# Patient Record
Sex: Male | Born: 1970 | Race: White | Hispanic: No | Marital: Married
Health system: Southern US, Community
[De-identification: ages and names within clinical notes are randomized; demographics above are authoritative.]

## PROBLEM LIST (undated history)

## (undated) DIAGNOSIS — I2510 Atherosclerotic heart disease of native coronary artery without angina pectoris: Secondary | ICD-10-CM

## (undated) DIAGNOSIS — D860 Sarcoidosis of lung: Secondary | ICD-10-CM

## (undated) DIAGNOSIS — I214 Non-ST elevation (NSTEMI) myocardial infarction: Secondary | ICD-10-CM

## (undated) DIAGNOSIS — I10 Essential (primary) hypertension: Secondary | ICD-10-CM

## (undated) DIAGNOSIS — M109 Gout, unspecified: Secondary | ICD-10-CM

## (undated) DIAGNOSIS — E669 Obesity, unspecified: Secondary | ICD-10-CM

## (undated) HISTORY — PX: INGUINAL HERNIA REPAIR: SHX194

---

## 1998-09-28 ENCOUNTER — Ambulatory Visit (HOSPITAL_COMMUNITY): Admission: RE | Admit: 1998-09-28 | Discharge: 1998-09-28 | Payer: Self-pay | Admitting: Internal Medicine

## 1998-09-28 ENCOUNTER — Encounter: Payer: Self-pay | Admitting: Internal Medicine

## 1998-11-02 ENCOUNTER — Ambulatory Visit: Admission: RE | Admit: 1998-11-02 | Discharge: 1998-11-02 | Payer: Self-pay | Admitting: Internal Medicine

## 1999-04-27 ENCOUNTER — Inpatient Hospital Stay (HOSPITAL_COMMUNITY): Admission: EM | Admit: 1999-04-27 | Discharge: 1999-05-02 | Payer: Self-pay | Admitting: Emergency Medicine

## 1999-04-28 ENCOUNTER — Encounter: Payer: Self-pay | Admitting: Critical Care Medicine

## 2003-04-23 ENCOUNTER — Ambulatory Visit (HOSPITAL_COMMUNITY): Admission: RE | Admit: 2003-04-23 | Discharge: 2003-04-23 | Payer: Self-pay | Admitting: Urology

## 2003-04-23 ENCOUNTER — Encounter (INDEPENDENT_AMBULATORY_CARE_PROVIDER_SITE_OTHER): Payer: Self-pay | Admitting: Specialist

## 2010-01-26 ENCOUNTER — Encounter (INDEPENDENT_AMBULATORY_CARE_PROVIDER_SITE_OTHER): Payer: Self-pay | Admitting: Cardiology

## 2010-01-26 ENCOUNTER — Observation Stay (HOSPITAL_COMMUNITY): Admission: EM | Admit: 2010-01-26 | Discharge: 2010-01-27 | Payer: Self-pay | Admitting: Emergency Medicine

## 2010-10-22 LAB — DIFFERENTIAL
Basophils Absolute: 0 K/uL (ref 0.0–0.1)
Basophils Relative: 0 % (ref 0–1)
Eosinophils Absolute: 0 K/uL (ref 0.0–0.7)
Eosinophils Relative: 0 % (ref 0–5)
Lymphocytes Relative: 13 % (ref 12–46)
Lymphs Abs: 1.3 K/uL (ref 0.7–4.0)
Monocytes Absolute: 0.9 K/uL (ref 0.1–1.0)
Monocytes Relative: 9 % (ref 3–12)
Neutro Abs: 7.8 K/uL — ABNORMAL HIGH (ref 1.7–7.7)
Neutrophils Relative %: 78 % — ABNORMAL HIGH (ref 43–77)

## 2010-10-22 LAB — RAPID URINE DRUG SCREEN, HOSP PERFORMED
Amphetamines: NOT DETECTED
Benzodiazepines: NOT DETECTED
Cocaine: NOT DETECTED
Opiates: NOT DETECTED
Tetrahydrocannabinol: NOT DETECTED

## 2010-10-22 LAB — COMPREHENSIVE METABOLIC PANEL
ALT: 31 U/L (ref 0–53)
Albumin: 3.9 g/dL (ref 3.5–5.2)
CO2: 19 mEq/L (ref 19–32)
Calcium: 9.2 mg/dL (ref 8.4–10.5)
Creatinine, Ser: 1.2 mg/dL (ref 0.4–1.5)
Glucose, Bld: 124 mg/dL — ABNORMAL HIGH (ref 70–99)
Potassium: 3.3 mEq/L — ABNORMAL LOW (ref 3.5–5.1)
Sodium: 139 mEq/L (ref 135–145)
Total Bilirubin: 0.6 mg/dL (ref 0.3–1.2)
Total Protein: 7.4 g/dL (ref 6.0–8.3)

## 2010-10-22 LAB — BASIC METABOLIC PANEL
Chloride: 106 mEq/L (ref 96–112)
Creatinine, Ser: 1.2 mg/dL (ref 0.4–1.5)
GFR calc Af Amer: >60 mL/min (ref >60)
Glucose, Bld: 95 mg/dL (ref 70–99)

## 2010-10-22 LAB — CBC
HCT: 47.3 % (ref 39.0–52.0)
Hemoglobin: 16.5 g/dL (ref 13.0–17.0)
MCH: 30.7 pg (ref 26.0–34.0)
MCHC: 34.8 g/dL (ref 30.0–36.0)
MCV: 88.1 fL (ref 78.0–100.0)
Platelets: 200 K/uL (ref 150–400)
RBC: 5.36 MIL/uL (ref 4.22–5.81)
RDW: 12.5 % (ref 11.5–15.5)
WBC: 10.1 K/uL (ref 4.0–10.5)

## 2010-10-22 LAB — CARDIAC PANEL(CRET KIN+CKTOT+MB+TROPI)
CK, MB: 1.6 ng/mL (ref 0.3–4.0)
CK, MB: 1.8 ng/mL (ref 0.3–4.0)
Relative Index: 1.5 (ref 0.0–2.5)
Relative Index: 1.6 (ref 0.0–2.5)
Total CK: 108 U/L (ref 7–232)
Total CK: 114 U/L (ref 7–232)

## 2010-10-22 LAB — LIPID PANEL
HDL: 35 mg/dL — ABNORMAL LOW
Total CHOL/HDL Ratio: 5.2 ratio
Triglycerides: 64 mg/dL
VLDL: 13 mg/dL (ref 0–40)

## 2010-10-22 LAB — URINALYSIS, ROUTINE W REFLEX MICROSCOPIC
Glucose, UA: NEGATIVE mg/dL
Hgb urine dipstick: NEGATIVE
Ketones, ur: NEGATIVE mg/dL
Nitrite: NEGATIVE
Protein, ur: NEGATIVE mg/dL
Specific Gravity, Urine: 1.031 — ABNORMAL HIGH (ref 1.005–1.030)
Urobilinogen, UA: 0.2 mg/dL (ref 0.0–1.0)

## 2010-10-22 LAB — POCT I-STAT, CHEM 8
BUN: 12 mg/dL (ref 6–23)
Calcium, Ion: 1.03 mmol/L — ABNORMAL LOW (ref 1.12–1.32)
Chloride: 108 meq/L (ref 96–112)
Creatinine, Ser: 1.1 mg/dL (ref 0.4–1.5)
Glucose, Bld: 125 mg/dL — ABNORMAL HIGH (ref 70–99)
HCT: 49 % (ref 39.0–52.0)
Hemoglobin: 16.7 g/dL (ref 13.0–17.0)
Potassium: 3.3 meq/L — ABNORMAL LOW (ref 3.5–5.1)
Sodium: 140 meq/L (ref 135–145)
TCO2: 19 mmol/L (ref 0–100)

## 2010-10-22 LAB — TROPONIN I

## 2010-10-22 LAB — CK TOTAL AND CKMB (NOT AT ARMC)
CK, MB: 1.7 ng/mL (ref 0.3–4.0)
Relative Index: 1.5 (ref 0.0–2.5)
Total CK: 114 U/L (ref 7–232)

## 2010-10-22 LAB — TSH: TSH: 5.232 u[IU]/mL — ABNORMAL HIGH (ref 0.350–4.500)

## 2010-10-22 LAB — POCT CARDIAC MARKERS: Myoglobin, poc: 108 ng/mL (ref 12–200)

## 2010-12-22 NOTE — Op Note (Signed)
NAME:  Brett Harper, Brett Harper NO.:  [id number]   MEDICAL RECORD NO.:  [id number]                   PATIENT TYPE:  AMB   LOCATION:  [facility]                                 FACILITY:  [facility]   PHYSICIAN:  Ronald L. Ovidio Hanger, M.D.           DATE OF BIRTH:  10-Jun-1971   DATE OF PROCEDURE:  05/09/2003  DATE OF DISCHARGE:  04/23/2003                                 OPERATIVE REPORT   PREOPERATIVE DIAGNOSIS:  Multiple left testicular masses.   OPERATION/PROCEDURE:  Left inguinal exploration of testicle with excisional  biopsy of testicular masses and frozen section and flexible  cystourethroscopy.   SURGEON:  Lucrezia Starch. Earlene Plater, M.D.   ANESTHESIA:  LMA as well as 15 ml.   TUBES:  None.   COMPLICATIONS:  None   INDICATIONS:  Brett Harper is a very nice 40 year old white male who presented  with multiple left testicular masses.  He had noticed he had one and it  really was not tender but multiple have appeared and on scrotal ultrasound  in the office on the left side, he was noted to have multiple solid renal  masses that appeared to be approximately the same size.  CT scan of the  chest, abdomen and pelvis were essentially negative and beta hCG, alpha-  fetoprotein and LDH were negative.  After understanding risks, benefits and  alternatives, he elected to proceed with the above procedure.   DESCRIPTION OF PROCEDURE:  The patient was placed in the supine position.  Under proper LMA anesthesia he was prepped and draped in the Betadine  sterile fashion and left inguinal incision was made.  Sharp dissection  was  carried down to the inguinal region.  The shelving edge of Poupart's fascia  was incised to unroof the inguinal canal and the cord was delivered.  The  ilioinguinal nerve was identified and protected.  Testicle and adnexa were  delivered.  Gubernacular fibers were dissected.  The testicle was then  isolated.  The cord was wrapped with a quarter-inch Penrose  drain during  retrograde flow of potential tumor cells.  And the tunica vaginalis was  opened.  Multiple white nodular areas were noted to be present throughout  the testicle and the previously defined ultrasound locations.  A wedge  biopsy was obtained and one was sent for frozen section.  It was found to  have essentially granulomas which were noncaseating.  Two others were wedged  out for both permanent and frozen section and again frozen section revealed  granulomas only.  No neoplasia.  Tissue specimen was then submitted for  special stains, tissue cultures, TB, fungi, aerobic, anaerobic, etc., and  permanent section.  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  Irrigation  was performed with good hemostasis noted to be present.  The fascia was closed with running 3-0 Vicryl suture.  Scarpa's fascia was  closed with interrupted 3-0 chromic catgut. Skin was closed with skin  stables.  Testicle was well into the scrotal sac.  It might be noted a  quarter-inch Penrose drain was placed through a separate stab incision and  tunneled through the canal in the scrotal area and the patient was then  sutured in placed in place with chromic catgut.  The patient was taken to  the recovery room stable with no complications.                                               Ronald L. Ovidio Hanger, M.D.    RLD/MEDQ  D:  05/09/2003  T:  05/09/2003  Job:  [id number]

## 2011-08-14 ENCOUNTER — Other Ambulatory Visit (HOSPITAL_COMMUNITY): Payer: Self-pay | Admitting: Neurosurgery

## 2011-08-14 ENCOUNTER — Other Ambulatory Visit: Payer: Self-pay | Admitting: Neurosurgery

## 2011-08-14 DIAGNOSIS — M5416 Radiculopathy, lumbar region: Secondary | ICD-10-CM

## 2011-08-22 ENCOUNTER — Ambulatory Visit (HOSPITAL_COMMUNITY)
Admission: RE | Admit: 2011-08-22 | Discharge: 2011-08-22 | Disposition: A | Discharge status: Home / Self Care | Payer: BC Managed Care – PPO | Source: Ambulatory Visit | Attending: Neurosurgery | Admitting: Neurosurgery

## 2011-08-22 ENCOUNTER — Other Ambulatory Visit (HOSPITAL_COMMUNITY): Payer: Self-pay

## 2011-08-22 DIAGNOSIS — M5416 Radiculopathy, lumbar region: Secondary | ICD-10-CM

## 2011-08-22 DIAGNOSIS — M545 Low back pain, unspecified: Secondary | ICD-10-CM | POA: Insufficient documentation

## 2011-08-22 DIAGNOSIS — M5126 Other intervertebral disc displacement, lumbar region: Secondary | ICD-10-CM | POA: Insufficient documentation

## 2011-08-22 DIAGNOSIS — M25559 Pain in unspecified hip: Secondary | ICD-10-CM | POA: Insufficient documentation

## 2011-08-22 MED ORDER — DIAZEPAM 5 MG PO TABS
ORAL_TABLET | ORAL | Status: AC
  Filled 2011-08-22: qty 2

## 2011-08-22 MED ORDER — ONDANSETRON HCL 4 MG/2ML IJ SOLN: 4.0000 mg | Freq: Four times a day (QID) | INTRAMUSCULAR | Status: DC | PRN

## 2011-08-22 MED ORDER — OXYCODONE-ACETAMINOPHEN 5-325 MG PO TABS
1.0000 | ORAL_TABLET | ORAL | Status: DC | PRN
  Administered 2011-08-22: 2 via ORAL

## 2011-08-22 MED ORDER — DIAZEPAM 5 MG PO TABS
10.0000 mg | ORAL_TABLET | Freq: Once | ORAL | Status: AC
  Administered 2011-08-22: 10 mg via ORAL

## 2011-08-22 MED ORDER — OXYCODONE-ACETAMINOPHEN 5-325 MG PO TABS
ORAL_TABLET | ORAL | Status: AC
  Administered 2011-08-22: 2 via ORAL
  Filled 2011-08-22: qty 2

## 2011-08-22 MED ORDER — IOHEXOL 180 MG/ML  SOLN
20.0000 mL | Freq: Once | INTRAMUSCULAR | Status: AC | PRN
  Administered 2011-08-22: 20 mL via INTRATHECAL

## 2011-08-22 NOTE — Discharge Instructions (Signed)
Myelography Myelography is an X-ray test that uses a dye to look at your spine or neck. This test is usually done to look for:  Back pain.   Neck pain.   Arm or leg weakness or numbness.  BEFORE THE PROCEDURE  Take your medicine as told by your doctor.   Eat food and drink fluids as told by your doctor.   Do not drive. Have someone else drive you to the test and drive you home.   Your doctor will talk to you about risks of the myelography and answer questions you may have.  PROCEDURE  You will be awake during the procedure.   You may be asked to lie on your stomach during the procedure.   Your neck or back will be cleaned.   A numbing medicine will be injected into that area.   A small needle is inserted through the skin that was numbed. A dye will be put into the needle.   The needle will be taken out.   X-ray pictures will be taken of your neck or back.   You may also have more procedures to get different views of your neck or back.  AFTER THE PROCEDURE  You will rest in a recovery room until you are stable and doing well.   You will lie down with your head resting on 1 pillow.   You may be given something to eat or drink.   Someone will need to drive you home.  Finding out the results of your test Ask when your test results will be ready. Make sure you get your test results. Document Released: 05/01/2008 Document Revised: 03/21/2011 Document Reviewed: 05/01/2008 [employer] Patient Information 2012 [place], [place].

## 2011-08-22 NOTE — Progress Notes (Signed)
Discharge instructions reviewed with patient and wife. Verbally indicated understanding. Copy of discharge instructions given. Pain easing and around 4/10 now right hip and leg. Puncture site to back clean and dry. Tolerated food and fluids well.

## 2011-08-22 NOTE — Progress Notes (Signed)
Voided in [facility]. Discharged in w/c with RN to be driven home by wife. Aware he should lie down for the ride.

## 2011-08-22 NOTE — Progress Notes (Signed)
To nurses station post myelogram. Pain is easing now 4-5/10, post Percocet. Puncture site to back clean and dry, no bandaid. Given crackers and coke.

## 2011-08-22 NOTE — Progress Notes (Signed)
Dr. Mikal Plane paged re need for pain meds, called back and stated he would put in the orders.

## 2011-08-22 NOTE — H&P (Signed)
BP 137/84  Pulse 84  Temp(Src) 98.4 F (36.9 C) (Oral)  Resp 18  Ht 5\' 11"  (1.803 m)  Wt 142.883 kg (315 lb)  BMI 43.93 kg/m2  SpO2 90% HISTORY:     Brett Harper is a 41 year old gentleman who presents today for evaluation of severe pain which he has in his right hip which extends to the right knee.  The pain never goes below the knee.  The pain is constant whether he is standing, lying or sitting.  It is worse whenever he has to bear weight.  He walks with a decided limp and has done so since October 12th.  He has had no bowel or bladder dysfunction.  He was on steroids for approximately a month.  He has absolutely no discomfort on his left side.  He describes pain, numbness and burning in the right thigh.  He said this started right after he received treatment for a kidney stone and he was actually in the hospital when the pain started.  He has had absolutely no trauma.  He says he is still trying to get to work and he is not trying to take any time off.  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  He has never had pain like this before.  He feels weakness in his right leg, numbness in the right leg.  He does report fatigue also and difficulty with sleeping and insomnia.    PAST MEDICAL HISTORY:  Significant for hypertension and sarcoid.    FAMILY HISTORY:    Mother 4 in very good health, has undergone a hip and knee replacement.  Father is deceased.  Father had both cerebral vascular accident and myocardial infarction.    PAST SURGICAL HISTORY:  He has had a kidney stone removed.  He has undergone a herniorrhaphy and testicular surgery.    DRUG ALLERGIES:   HE IS UNABLE TO TAKE NAPROSYN AS IT CAUSES HIM TO BLEED.  HE DID NOT COMMENT ANYMORE ABOUT BLEED.    SOCIAL HISTORY:    He does not smoke.  He does drink alcohol.  He does not use illicit drugs.  He states that is 5', 11" and 320 lbs.  Pulse on examination is 63 and respiratory rate was 16.    REVIEW OF  SYSTEMS:   Positive for hypertension, leg pain with walking, kidney stones, leg weakness, back pain, leg pain.  He denies constitutional, eye, ear, nose, throat, mouth, respiratory, gastrointestinal, skin, neurological, psychiatric, endocrine, hematologic and allergic problems.    MEDICATIONS:    Currently he is taking Lisinopril and Vicodin.  He is taking 8 Vicodin tablets a day for his pain.     EXAMINATION:    On examination he is alert, oriented x 4 and answering all questions appropriately.  Pain with passive flexion, abduction and internal and external rotation of the right hip.  Negative straight leg raising.  Intact proprioception.  Intact light touch, though it is different in his right thigh compared to the left.  He has normal muscle tone, bulk and coordination.  Markedly antalgic gait favoring the right lower extremity.  He hops almost when he is walking.  2+ reflexes which are very brisk and energetic at both knees and both ankles.  Normal reflexes 2+ in the upper extremities biceps, triceps and brachioradialis.  Normal muscle tone, bulk and coordination in the upper extremities.  Pupils are equal, round and reactive to light.  Full extraocular movements.  Symmetric facies.  Symmetric facial movements.  Hearing intact to voice bilaterally.    DIAGNOSTIC STUDIES:   MRI is reviewed.  It shows what is a tiny disc at L5-S1, if I can even call it that, but there is no overt compression of the S1 root or of the L5 root.  The foramen is widely patent and I just do not see what the radiologist talks about when he says that the central disc impacts upon the right S1 root.  But, he has nothing at L1-2, L2-3 or L3-4.  He has some degeneration present at the disc spaces, but no neural compression.    I read a report of the November 11th CT scan of the abdomen when they were evaluating him for the kidney stone.  There is no mention made of some abnormality around the right hip and he is exquisitely tender  with palpation of the right hip and the pain that he has certainly doesn't fit an S1 radiculopathy.  The pain stops at the knee, never goes below that and is anterior.  I will do the myelogram to make sure there is nothing here in the spine.  I didn't see a lateral disc or anything hiding elsewhere on the scan.  But, he is in a lot of pain and something is causing it and we will just go ahead and get this workup done from my standpoint as fast as we can.

## 2011-08-23 NOTE — Procedures (Signed)
.  Procedure: Lumbar Myelogram via LP with Fluoroscopic Guidance. Specimen: None Bleeding: Minimal. Complications: None immediate. Patient   -Condition: Stable.  -Disposition:  To outpatient Post Op Holding area for monitoring.  See Full Radiology Report

## 2011-09-06 ENCOUNTER — Other Ambulatory Visit: Payer: Self-pay | Admitting: Orthopedic Surgery

## 2011-09-06 DIAGNOSIS — M25551 Pain in right hip: Secondary | ICD-10-CM

## 2011-09-08 ENCOUNTER — Ambulatory Visit
Admission: RE | Admit: 2011-09-08 | Discharge: 2011-09-08 | Disposition: A | Discharge status: Home / Self Care | Payer: BC Managed Care – PPO | Source: Ambulatory Visit | Attending: Orthopedic Surgery | Admitting: Orthopedic Surgery

## 2011-09-08 DIAGNOSIS — M25551 Pain in right hip: Secondary | ICD-10-CM

## 2011-09-09 ENCOUNTER — Other Ambulatory Visit: Payer: BC Managed Care – PPO

## 2014-02-02 ENCOUNTER — Emergency Department (HOSPITAL_COMMUNITY): Payer: BC Managed Care – PPO

## 2014-02-02 ENCOUNTER — Encounter (HOSPITAL_COMMUNITY): Payer: Self-pay | Admitting: Emergency Medicine

## 2014-02-02 ENCOUNTER — Inpatient Hospital Stay (HOSPITAL_COMMUNITY)
Admission: EM | Admit: 2014-02-02 | Discharge: 2014-02-05 | DRG: 247 | Disposition: A | Discharge status: Home / Self Care | Payer: BC Managed Care – PPO | Attending: Cardiology | Admitting: Cardiology

## 2014-02-02 DIAGNOSIS — Z8249 Family history of ischemic heart disease and other diseases of the circulatory system: Secondary | ICD-10-CM

## 2014-02-02 DIAGNOSIS — R079 Chest pain, unspecified: Secondary | ICD-10-CM | POA: Diagnosis present

## 2014-02-02 DIAGNOSIS — M109 Gout, unspecified: Secondary | ICD-10-CM

## 2014-02-02 DIAGNOSIS — I10 Essential (primary) hypertension: Secondary | ICD-10-CM | POA: Diagnosis present

## 2014-02-02 DIAGNOSIS — Z9889 Other specified postprocedural states: Secondary | ICD-10-CM

## 2014-02-02 DIAGNOSIS — J99 Respiratory disorders in diseases classified elsewhere: Secondary | ICD-10-CM | POA: Diagnosis present

## 2014-02-02 DIAGNOSIS — D869 Sarcoidosis, unspecified: Secondary | ICD-10-CM | POA: Diagnosis present

## 2014-02-02 DIAGNOSIS — F419 Anxiety disorder, unspecified: Secondary | ICD-10-CM | POA: Diagnosis present

## 2014-02-02 DIAGNOSIS — E669 Obesity, unspecified: Secondary | ICD-10-CM | POA: Diagnosis present

## 2014-02-02 DIAGNOSIS — Z6841 Body Mass Index (BMI) 40.0 and over, adult: Secondary | ICD-10-CM

## 2014-02-02 DIAGNOSIS — I214 Non-ST elevation (NSTEMI) myocardial infarction: Principal | ICD-10-CM | POA: Diagnosis present

## 2014-02-02 DIAGNOSIS — Z8719 Personal history of other diseases of the digestive system: Secondary | ICD-10-CM | POA: Insufficient documentation

## 2014-02-02 DIAGNOSIS — F411 Generalized anxiety disorder: Secondary | ICD-10-CM | POA: Diagnosis present

## 2014-02-02 DIAGNOSIS — I2510 Atherosclerotic heart disease of native coronary artery without angina pectoris: Secondary | ICD-10-CM | POA: Diagnosis present

## 2014-02-02 DIAGNOSIS — D860 Sarcoidosis of lung: Secondary | ICD-10-CM

## 2014-02-02 DIAGNOSIS — E785 Hyperlipidemia, unspecified: Secondary | ICD-10-CM | POA: Diagnosis present

## 2014-02-02 DIAGNOSIS — I25110 Atherosclerotic heart disease of native coronary artery with unstable angina pectoris: Secondary | ICD-10-CM

## 2014-02-02 HISTORY — DX: Gout, unspecified: M10.9

## 2014-02-02 HISTORY — DX: Sarcoidosis of lung: D86.0

## 2014-02-02 HISTORY — DX: Obesity, unspecified: E66.9

## 2014-02-02 LAB — PROTIME-INR
INR: 1.02 (ref 0.00–1.49)
PROTHROMBIN TIME: 13.4 s (ref 11.6–15.2)

## 2014-02-02 LAB — CBC WITH DIFFERENTIAL/PLATELET
Basophils Absolute: 0 10*3/uL (ref 0.0–0.1)
Basophils Relative: 0 % (ref 0–1)
EOS ABS: 0.2 10*3/uL (ref 0.0–0.7)
EOS PCT: 3 % (ref 0–5)
HCT: 41 % (ref 39.0–52.0)
HEMOGLOBIN: 13.7 g/dL (ref 13.0–17.0)
LYMPHS ABS: 1.5 10*3/uL (ref 0.7–4.0)
LYMPHS PCT: 18 % (ref 12–46)
MCH: 29.8 pg (ref 26.0–34.0)
MCHC: 33.4 g/dL (ref 30.0–36.0)
MCV: 89.1 fL (ref 78.0–100.0)
MONOS PCT: 9 % (ref 3–12)
Monocytes Absolute: 0.8 10*3/uL (ref 0.1–1.0)
NEUTROS PCT: 70 % (ref 43–77)
Neutro Abs: 6.1 10*3/uL (ref 1.7–7.7)
Platelets: 208 10*3/uL (ref 150–400)
RBC: 4.6 MIL/uL (ref 4.22–5.81)
RDW: 13.7 % (ref 11.5–15.5)
WBC: 8.6 10*3/uL (ref 4.0–10.5)

## 2014-02-02 LAB — I-STAT CHEM 8, ED
BUN: 14 mg/dL (ref 6–23)
CREATININE: 1.1 mg/dL (ref 0.50–1.35)
Calcium, Ion: 1.1 mmol/L — ABNORMAL LOW (ref 1.12–1.23)
Chloride: 104 mEq/L (ref 96–112)
Glucose, Bld: 101 mg/dL — ABNORMAL HIGH (ref 70–99)
HCT: 43 % (ref 39.0–52.0)
Hemoglobin: 14.6 g/dL (ref 13.0–17.0)
POTASSIUM: 4 meq/L (ref 3.7–5.3)
SODIUM: 140 meq/L (ref 137–147)
TCO2: 26 mmol/L (ref 0–100)

## 2014-02-02 LAB — I-STAT TROPONIN, ED
TROPONIN I, POC: 0.3 ng/mL — AB (ref 0.00–0.08)
Troponin i, poc: 0.05 ng/mL (ref 0.00–0.08)

## 2014-02-02 LAB — TROPONIN I: Troponin I: 0.45 ng/mL (ref <0.30)

## 2014-02-02 LAB — SEDIMENTATION RATE: Sed Rate: 22 mm/hr — ABNORMAL HIGH (ref 0–16)

## 2014-02-02 LAB — TSH: TSH: 1.33 u[IU]/mL (ref 0.350–4.500)

## 2014-02-02 LAB — D-DIMER, QUANTITATIVE: D-Dimer, Quant: 0.59 ug/mL-FEU — ABNORMAL HIGH (ref 0.00–0.48)

## 2014-02-02 LAB — MAGNESIUM: Magnesium: 1.9 mg/dL (ref 1.5–2.5)

## 2014-02-02 MED ORDER — OXYCODONE-ACETAMINOPHEN 5-325 MG PO TABS
1.0000 | ORAL_TABLET | ORAL | Status: DC | PRN
  Administered 2014-02-04: 09:00:00 1 via ORAL
  Filled 2014-02-02: qty 1

## 2014-02-02 MED ORDER — MORPHINE SULFATE 4 MG/ML IJ SOLN
4.0000 mg | Freq: Once | INTRAMUSCULAR | Status: AC
  Administered 2014-02-02: 4 mg via INTRAVENOUS
  Filled 2014-02-02: qty 1

## 2014-02-02 MED ORDER — ASPIRIN 81 MG PO CHEW
324.0000 mg | CHEWABLE_TABLET | Freq: Once | ORAL | Status: AC
  Administered 2014-02-02: 324 mg via ORAL
  Filled 2014-02-02: qty 4

## 2014-02-02 MED ORDER — ACETAMINOPHEN 325 MG PO TABS
650.0000 mg | ORAL_TABLET | ORAL | Status: DC | PRN
  Administered 2014-02-03 – 2014-02-04 (×2): 650 mg via ORAL
  Filled 2014-02-02 (×2): qty 2

## 2014-02-02 MED ORDER — MORPHINE SULFATE 2 MG/ML IJ SOLN
2.0000 mg | INTRAMUSCULAR | Status: DC | PRN
  Administered 2014-02-02 – 2014-02-03 (×3): 2 mg via INTRAVENOUS
  Filled 2014-02-02 (×2): qty 1

## 2014-02-02 MED ORDER — SODIUM CHLORIDE 0.9 % IV SOLN: 1.0000 mL/kg/h | INTRAVENOUS | Status: DC

## 2014-02-02 MED ORDER — ONDANSETRON HCL 4 MG/2ML IJ SOLN
4.0000 mg | Freq: Four times a day (QID) | INTRAMUSCULAR | Status: DC | PRN
Start: 2014-02-02 — End: 2014-02-04

## 2014-02-02 MED ORDER — NITROGLYCERIN 0.4 MG SL SUBL: 0.4000 mg | SUBLINGUAL_TABLET | SUBLINGUAL | Status: DC | PRN

## 2014-02-02 MED ORDER — OXYCODONE HCL 5 MG PO TABS
2.5000 mg | ORAL_TABLET | ORAL | Status: DC | PRN
  Filled 2014-02-02: qty 1

## 2014-02-02 MED ORDER — SODIUM CHLORIDE 0.9 % IV SOLN: 250.0000 mL | INTRAVENOUS | Status: DC | PRN

## 2014-02-02 MED ORDER — ATORVASTATIN CALCIUM 80 MG PO TABS
80.0000 mg | ORAL_TABLET | Freq: Every day | ORAL | Status: DC
  Administered 2014-02-02 – 2014-02-04 (×3): 80 mg via ORAL
  Filled 2014-02-02 (×5): qty 1

## 2014-02-02 MED ORDER — SODIUM CHLORIDE 0.9 % IJ SOLN
3.0000 mL | Freq: Two times a day (BID) | INTRAMUSCULAR | Status: DC
  Administered 2014-02-02: 3 mL via INTRAVENOUS

## 2014-02-02 MED ORDER — HEPARIN BOLUS VIA INFUSION
4000.0000 [IU] | Freq: Once | INTRAVENOUS | Status: AC
  Administered 2014-02-02: 4000 [IU] via INTRAVENOUS
  Filled 2014-02-02: qty 4000

## 2014-02-02 MED ORDER — SODIUM CHLORIDE 0.9 % IJ SOLN: 3.0000 mL | INTRAMUSCULAR | Status: DC | PRN

## 2014-02-02 MED ORDER — MORPHINE SULFATE 2 MG/ML IJ SOLN
INTRAMUSCULAR | Status: AC
  Administered 2014-02-03: 2 mg via INTRAVENOUS
  Filled 2014-02-02: qty 1

## 2014-02-02 MED ORDER — ASPIRIN EC 81 MG PO TBEC
81.0000 mg | DELAYED_RELEASE_TABLET | Freq: Every day | ORAL | Status: DC
  Administered 2014-02-05: 09:00:00 81 mg via ORAL
  Filled 2014-02-02 (×2): qty 1

## 2014-02-02 MED ORDER — HEPARIN (PORCINE) IN NACL 100-0.45 UNIT/ML-% IJ SOLN
1800.0000 [IU]/h | INTRAMUSCULAR | Status: DC
  Administered 2014-02-02: 1400 [IU]/h via INTRAVENOUS
  Administered 2014-02-03: 1800 [IU]/h via INTRAVENOUS
  Filled 2014-02-02 (×3): qty 250

## 2014-02-02 MED ORDER — ASPIRIN 300 MG RE SUPP: 300.0000 mg | RECTAL | Status: AC

## 2014-02-02 MED ORDER — OXYCODONE-ACETAMINOPHEN 7.5-325 MG PO TABS: 1.0000 | ORAL_TABLET | ORAL | Status: DC | PRN

## 2014-02-02 MED ORDER — IOHEXOL 350 MG/ML SOLN
100.0000 mL | Freq: Once | INTRAVENOUS | Status: AC | PRN
  Administered 2014-02-02: 85 mL via INTRAVENOUS

## 2014-02-02 MED ORDER — METOPROLOL TARTRATE 12.5 MG HALF TABLET
12.5000 mg | ORAL_TABLET | Freq: Two times a day (BID) | ORAL | Status: DC
  Administered 2014-02-02 – 2014-02-05 (×5): 12.5 mg via ORAL
  Filled 2014-02-02 (×8): qty 1

## 2014-02-02 MED ORDER — ASPIRIN 81 MG PO CHEW
81.0000 mg | CHEWABLE_TABLET | ORAL | Status: AC
  Administered 2014-02-03: 81 mg via ORAL
  Filled 2014-02-02: qty 1

## 2014-02-02 MED ORDER — SODIUM CHLORIDE 0.9 % IJ SOLN
3.0000 mL | Freq: Two times a day (BID) | INTRAMUSCULAR | Status: DC
  Administered 2014-02-02 – 2014-02-03 (×2): 3 mL via INTRAVENOUS

## 2014-02-02 MED ORDER — LISINOPRIL 20 MG PO TABS
20.0000 mg | ORAL_TABLET | Freq: Every day | ORAL | Status: DC
  Filled 2014-02-02 (×2): qty 1

## 2014-02-02 MED ORDER — SODIUM CHLORIDE 0.9 % IV SOLN
250.0000 mL | INTRAVENOUS | Status: DC | PRN
Start: 2014-02-02 — End: 2014-02-04
  Administered 2014-02-02: 250 mL via INTRAVENOUS

## 2014-02-02 MED ORDER — MORPHINE SULFATE 4 MG/ML IJ SOLN
6.0000 mg | Freq: Once | INTRAMUSCULAR | Status: AC
  Administered 2014-02-02: 6 mg via INTRAVENOUS
  Filled 2014-02-02: qty 2

## 2014-02-02 MED ORDER — ASPIRIN 81 MG PO CHEW: 324.0000 mg | CHEWABLE_TABLET | ORAL | Status: AC

## 2014-02-02 NOTE — ED Notes (Signed)
Attempted to call report  RN was unavaliable

## 2014-02-02 NOTE — Progress Notes (Signed)
ANTICOAGULATION CONSULT NOTE - Initial Consult  Pharmacy Consult for Heparin Indication: chest pain/ACS  Allergies  Allergen Reactions  . Naproxen Other (See Comments)    bleeding    Patient Measurements: Height: 5\' 11"  (180.3 cm) Weight: 320 lb (145.151 kg) IBW/kg (Calculated) : 75.3 Heparin Dosing Weight: 109 kg  Vital Signs: Temp: 98 F (36.7 C) (06/30 1618) Temp src: Oral (06/30 1045) BP: 101/51 mmHg (06/30 1630) Pulse Rate: 84 (06/30 1630)  Labs:  Recent Labs  02/02/14 0815 02/02/14 0843  HGB 13.7 14.6  HCT 41.0 43.0  PLT 208  --   CREATININE  --  1.10    Estimated Creatinine Clearance: 123.9 ml/min (by C-G formula based on Cr of 1.1).   Medical History: Past Medical History  Diagnosis Date  . Hypertension   . Pulmonary sarcoidosis     a. 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 H/O inguinal hernia repair   . Gout   . Obesity     Assessment: [age] YOM with history of obesity, pulmonary sarcoidosis, gout, and HTN in ED with chest pain to be admitted for possible cath in AM to start IV heparin per pharmacy dosing.   Anticoagulation: H/H/Platelets within normal limits. No bleeding noted. CT Angio is negative for pulmonary embolus.  No anticoagulation prior to admission. SCr within normal limits.   Goal of Therapy:  Heparin level 0.3-0.7 units/ml Monitor platelets by anticoagulation protocol: Yes   Plan:  1. Heparin bolus of 4000 units x1 2. Heparin drip at 1400 units/hr.  3. Heparin level in 6 hours. 4. Daily heparin level and CBC while on therapy.   Brett SnufferJessica Harper, PharmD, BCPS Clinical Pharmacist [phone] 02/02/2014,5:01 PM

## 2014-02-02 NOTE — ED Provider Notes (Signed)
CSN: [id number]     Arrival date & time 02/02/14  0745 History   First MD Initiated Contact with Patient 02/02/14 0759     Chief Complaint  Patient presents with  . Chest Pain     (Consider location/radiation/quality/duration/timing/severity/associated sxs/prior Treatment) HPI Complains of anterior left-sided chest pain nonradiating gradual in onset 6 AM today. Pain is worse with sitting up improves with lying supine. Also worse with deep inspiration. Admits to mild shortness of breath. No other associated symptoms. Patient was treated by EMS with one sublingual nitroglycerin and aspirin with partial relief. No other associated symptoms. Past Medical History  Diagnosis Date  . Hypertension    History reviewed. No pertinent past surgical history. No family history on file. History  Substance Use Topics  . Smoking status: Never Smoker   . Smokeless tobacco: Not on file  . Alcohol Use: No   cardiac risk factors hypertension, family history. Father had MI age 54  Review of Systems  Constitutional: Negative.   HENT: Negative.   Respiratory: Positive for shortness of breath.   Cardiovascular: Positive for chest pain.  Gastrointestinal: Negative.   Musculoskeletal: Negative.   Skin: Negative.   Neurological: Negative.   Psychiatric/Behavioral: Negative.   All other systems reviewed and are negative.     Allergies  Naproxen  Home Medications   Prior to Admission medications   Medication Sig Start Date End Date Taking? Authorizing Provider  lisinopril (PRINIVIL,ZESTRIL) 20 MG tablet Take 20 mg by mouth daily.    Historical Provider, MD   BP 129/65  Pulse 106  Temp(Src) 97.7 F (36.5 C) (Oral)  Resp 26  Ht 5\' 11"  (1.803 m)  Wt 320 lb (145.151 kg)  BMI 44.65 kg/m2  SpO2 95% Physical Exam  Nursing note and vitals reviewed. Constitutional: He appears well-developed and well-nourished.  HENT:  Head: Normocephalic and atraumatic.  Eyes: Conjunctivae are normal. Pupils  are equal, round, and reactive to light.  Neck: Neck supple. No tracheal deviation present. No thyromegaly present.  Cardiovascular: Normal rate and regular rhythm.   No murmur heard. Mildly tachycardic  Pulmonary/Chest: Effort normal and breath sounds normal.  Left anterior chest wall is tender. Pain is also reproducible by forcible abduction of left shoulder  Abdominal: Soft. Bowel sounds are normal. He exhibits no distension. There is no tenderness.  obese  Musculoskeletal: Normal range of motion. He exhibits no edema and no tenderness.  Neurological: He is alert. Coordination normal.  Skin: Skin is warm and dry. No rash noted.  Psychiatric: He has a normal mood and affect.    ED Course  Procedures (including critical care time) Labs Review Labs Reviewed - No data to display  Imaging Review No results found.   EKG Interpretation   Date/Time:  Tuesday February 02 2014 07:56:26 EDT Ventricular Rate:  109 PR Interval:  178 QRS Duration: 87 QT Interval:  318 QTC Calculation: 428 R Axis:   25 Text Interpretation:  Sinus tachycardia Abnormal R-wave progression, early  transition Since last tracing rate slower Confirmed by Ethelda Chick  MD, SAM  [id number]) on 02/02/2014 8:29:05 AM      Results for orders placed during the hospital encounter of 02/02/14  CBC WITH DIFFERENTIAL      Result Value Ref Range   WBC 8.6  4.0 - 10.5 K/uL   RBC 4.60  4.22 - 5.81 MIL/uL   Hemoglobin 13.7  13.0 - 17.0 g/dL   HCT 19.1  47.8 - 29.5 %   MCV 89.1  78.0 - 100.0 fL   MCH 29.8  26.0 - 34.0 pg   MCHC 33.4  30.0 - 36.0 g/dL   RDW 16.113.7  09.611.5 - 04.515.5 %   Platelets 208  150 - 400 K/uL   Neutrophils Relative % 70  43 - 77 %   Neutro Abs 6.1  1.7 - 7.7 K/uL   Lymphocytes Relative 18  12 - 46 %   Lymphs Abs 1.5  0.7 - 4.0 K/uL   Monocytes Relative 9  3 - 12 %   Monocytes Absolute 0.8  0.1 - 1.0 K/uL   Eosinophils Relative 3  0 - 5 %   Eosinophils Absolute 0.2  0.0 - 0.7 K/uL   Basophils Relative 0  0  - 1 %   Basophils Absolute 0.0  0.0 - 0.1 K/uL  D-DIMER, QUANTITATIVE      Result Value Ref Range   D-Dimer, Quant 0.59 (*) 0.00 - 0.48 ug/mL-FEU  I-STAT TROPOININ, ED      Result Value Ref Range   Troponin i, poc 0.05  0.00 - 0.08 ng/mL   Comment 3           I-STAT CHEM 8, ED      Result Value Ref Range   Sodium 140  137 - 147 mEq/L   Potassium 4.0  3.7 - 5.3 mEq/L   Chloride 104  96 - 112 mEq/L   BUN 14  6 - 23 mg/dL   Creatinine, Ser 4.091.10  0.50 - 1.35 mg/dL   Glucose, Bld 811101 (*) 70 - 99 mg/dL   Calcium, Ion 9.141.10 (*) 1.12 - 1.23 mmol/L   TCO2 26  0 - 100 mmol/L   Hemoglobin 14.6  13.0 - 17.0 g/dL   HCT 78.243.0  95.639.0 - 21.352.0 %  I-STAT TROPOININ, ED      Result Value Ref Range   Troponin i, poc 0.30 (*) 0.00 - 0.08 ng/mL   Comment NOTIFIED PHYSICIAN     Comment 3            Dg Chest 2 View  02/02/2014   CLINICAL DATA:  Chest pain  EXAM: CHEST  2 VIEW  COMPARISON:  01/26/2010  FINDINGS: Cardiac shadow is stable. The lungs are poorly aerated. No focal infiltrate or sizable effusion is noted. No acute bony abnormality is seen.  IMPRESSION: Poor inspiratory effort.  No focal confluent infiltrate is noted.   Electronically Signed   By: Alcide CleverMark  Lukens M.D.   On: 02/02/2014 09:15   Ct Angio Chest Pe W/cm &/or Wo Cm  02/02/2014   CLINICAL DATA:  Short of breath and chest pain.  Elevated D-dimer  EXAM: CT ANGIOGRAPHY CHEST WITH CONTRAST  TECHNIQUE: Multidetector CT imaging of the chest was performed using the standard protocol during bolus administration of intravenous contrast. Multiplanar CT image reconstructions and MIPs were obtained to evaluate the vascular anatomy.  CONTRAST:  85mL OMNIPAQUE IOHEXOL 350 MG/ML SOLN  COMPARISON:  CXR 02/02/2014  FINDINGS: Negative for pulmonary embolism. Negative for aortic aneurysm or dissection.  Coronary artery calcification. No pericardial effusion. Heart size upper normal.  The lungs are clear. Negative for pneumonia or edema. No pleural effusion. Negative  for mass or adenopathy.  Review of the MIP images confirms the above findings.  IMPRESSION: Negative for pulmonary embolism.  No acute abnormality.   Electronically Signed   By: Marlan Palauharles  Clark M.D.   On: 02/02/2014 14:50    Patient received pain relief after treatment with intravenous morphine.  Chest x-ray viewed by me MDM  Patient's pain is atypical for acute coronary syndrome. However he has had an incremental increase in serial troponins, giving him a heart score of 4. I've called cardiology to evaluate patient in ED. Aspirin administered Final diagnoses:  None   diagnosis NSTEMI  .    Doug SouSam Jacubowitz, MD 02/02/14 [phone]

## 2014-02-02 NOTE — ED Notes (Signed)
Results of troponin given to Dr. Ethelda ChickJacubowitz

## 2014-02-02 NOTE — ED Notes (Signed)
Pt. Given a box lunch and a sprite.

## 2014-02-02 NOTE — H&P (Signed)
Patient ID: HARLO FABELA MRN: [id number], DOB/AGE: [birth year]   Admit date: 02/02/2014   Primary Physician: Manon Hilding, MD Primary Cardiologist: New (consulted on by Dr. Radford Pax in the past)  Pt. Profile:  Brett Harper is a 43 y.o. male with a history of obesity, pulmonary sarcoidosis biopsy-confirmed, s/p R inguinal hernia repair 2006, gout and HTN who presented to [facility] today with chest pain.   The patient was in his usual state of health until this morning around 6 AM when he was leaving for work. He started to notice "feeling funny in his chest" but by the time he got to work he was in severe pain. He describes constant, anterior, left-sided, 8/10 chest pain that radiated down his left arm and was associated with shortness of breath, nausea, and syncope. The pain is made worse with deep inspiration and he feels better when supine. Sitting up straight and deep breathing makes it feel worse. He denies orthopnea, PND, lower extremity swelling. He denies recent illnesses, fevers chills or night sweats. He's never had anything like this before and is currently in mild chest discomfort after one sublingual nitroglycerin and aspirin  given by EMS. He has never smoked cigarettes, and does not drink or take illicit drugs. He does have a family history of heart disease. His father had his first MI in his 28s and has since passed from complications. Both grandfathers on his maternal and paternal side had heart disease and his cousin had a stent placed in his 43s. 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 He denies exertional chest pain but does admit to some shortness of breath occasionally. He does not regularly followup with primary care but has seen Dr. Quintin Alto in the past couple years. He denies a history of diabetes or hyperlipidemia. The only medication he takes is lisinopril.    Problem  List  Past Medical History  Diagnosis Date  . Hypertension   . Pulmonary sarcoidosis     a. 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 H/O inguinal hernia repair   . Gout     History reviewed. No pertinent past surgical history.   Allergies  Allergies  Allergen Reactions  . Naproxen Other (See Comments)    bleeding     Home Medications  Prior to Admission medications   Medication Sig Start Date End Date Taking? Authorizing Jovonni Borquez  lisinopril (PRINIVIL,ZESTRIL) 20 MG tablet Take 20 mg by mouth daily.   Yes Historical Ulis Kaps, MD  oxyCODONE-acetaminophen (PERCOCET) 7.5-325 MG per tablet Take 1 tablet by mouth every 4 (four) hours as needed (back pain).   Yes Historical Zala Degrasse, MD    Family History  Family History  Problem Relation Age of Onset  . Hypertension Mother   . Heart attack Father   . Diabetes Father   . Stroke Father   . Heart attack Maternal Grandfather   . Heart attack Paternal Grandfather   . Heart attack Cousin      stent placed in 23s   Family Status  Relation Status Death Age  . Mother Alive   . Father Deceased     heart attack     Social History  History   Social History  . Marital Status: Married    Spouse Name: N/A    Number of Children: N/A  . Years of Education: N/A  Occupational History  . Not on file.   Social History Main Topics  . Smoking status: Never Smoker   . Smokeless tobacco: Not on file  . Alcohol Use: No  . Drug Use: No  . Sexual Activity: Not on file   Other Topics Concern  . Not on file   Social History Narrative  . No narrative on file     Review of Systems General:  No chills, fever, night sweats or weight changes.  Cardiovascular:  ++ chest pain, no dyspnea on exertion, edema, orthopnea, palpitations, paroxysmal nocturnal dyspnea. Dermatological: No rash, lesions/masses Respiratory: No cough, dyspnea Urologic: No hematuria, dysuria Abdominal:    No nausea, vomiting, diarrhea, bright red blood per rectum, melena, or hematemesis Neurologic:  No visual changes, wkns, changes in mental status. All other systems reviewed and are otherwise negative except as noted above.  Physical Exam  Blood pressure 104/74, pulse 91, temperature 98.1 F (36.7 C), temperature source Oral, resp. rate 22, height '5\' 11"'  (1.803 m), weight 320 lb (145.151 kg), SpO2 95.00%.  General: Pleasant, NAD. Appears uncomfortable. obese Psych: Normal affect. Neuro: Alert and oriented X 3. Moves all extremities spontaneously. HEENT: Normal  Neck: Supple without bruits or JVD. Lungs:  Resp regular and unlabored, CTA. Difficult to auscultate due to body habitus Heart: RRR no s3, s4, or murmurs. tachycardic Abdomen: Soft, non-tender, non-distended, BS + x 4.  Extremities: No clubbing, cyanosis or edema. DP/PT/Radials 2+ and equal bilaterally.  Labs  Lab Results  Component Value Date   WBC 8.6 02/02/2014   HGB 14.6 02/02/2014   HCT 43.0 02/02/2014   MCV 89.1 02/02/2014   PLT 208 02/02/2014     Recent Labs Lab 02/02/14 0843  NA 140  K 4.0  CL 104  BUN 14  CREATININE 1.10  GLUCOSE 101*    Lab Results  Component Value Date   DDIMER 0.59* 02/02/2014     Radiology/Studies  Dg Chest 2 View  02/02/2014   CLINICAL DATA:  Chest pain  EXAM: CHEST  2 VIEW  COMPARISON:  01/26/2010  FINDINGS: Cardiac shadow is stable. The lungs are poorly aerated. No focal infiltrate or sizable effusion is noted. No acute bony abnormality is seen.  IMPRESSION: Poor inspiratory effort.  No focal confluent infiltrate is noted.      Ct Angio Chest Pe W/cm &/or Wo Cm  02/02/2014   CLINICAL DATA:  Short of breath and chest pain.  Elevated D-dimer  EXAM: CT ANGIOGRAPHY CHEST WITH CONTRAST  TECHNIQUE: Multidetector CT imaging of the chest was performed using the standard protocol during bolus administration of intravenous contrast. Multiplanar CT image reconstructions and MIPs were obtained  to evaluate the vascular anatomy.  CONTRAST:  58m OMNIPAQUE IOHEXOL 350 MG/ML SOLN  COMPARISON:  CXR 02/02/2014  FINDINGS: Negative for pulmonary embolism. Negative for aortic aneurysm or dissection.  Coronary artery calcification. No pericardial effusion. Heart size upper normal.  The lungs are clear. Negative for pneumonia or edema. No pleural effusion. Negative for mass or adenopathy.  Review of the MIP images confirms the above findings.  IMPRESSION: Negative for pulmonary embolism.  No acute abnormality.     ECG  Sinus tach  ASSESSMENT AND PLAN  WMANSOOR HILLYARDis a 43y.o. male with a history of obesity, pulmonary sarcoidosis biopsy-confirmed, s/p R inguinal hernia repair 2006, gout and HTN [facility].   NSTEMI- currently with mild chest pain. His chest pain is both typical and atypical and  he does have risk factors including family history, obesity and HTN. His chest pain is worse with deep inspiration. It radiates to his left arm, is associated with SOB, nausea and partially relieved by NTG. -- No elevated white count or temperature. Will check ESR/CRP for pericarditis -- Troponin mildly elevated at 0.3 -- Will order lipid panel and Hga1c for further risk stratification. TSH -- Will cycle enzymes and observe overnight. Will plan for cardiac cath in the AM as he would not be a good candidate for a nuclear stress test due to his weight.  -- Place on heparin gtt, ASA, statin and BB. Will discontinue lisinopil as his pressures have been on the soft side   Elevated D-dimer- 0.59. CTA neg for PE  HTN- d/c lisinopril and add on BB    Signed, Perry Mount, PA-C 02/02/2014, 4:19 PM  Pager [phone] The patient was seen in the emergency room with Perry Mount [facility]  Agree with assessment and plan as noted above.  The patient does have symptoms concerning for acute coronary syndrome with sudden onset of left chest discomfort and left arm radiation.  There  were no associated symptoms of dyspnea nausea and there was partial relief with sublingual nitroglycerin and 4 baby aspirin which were given in the field.  His electrocardiogram reviewed by me shows normal sinus rhythm and no ischemic changes.  His point-of-care troponin is elevated at 0.30. The patient has morbid obesity, weighing 320 pounds.  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 The patient has a strong family history of ischemic heart disease in his father died in his 29s of heart attacks and stroke. Careful auscultation does not reveal any audible pericardial friction rub. Plan will be to cycle enzymes repeat EKG and plan for left heart cardiac catheterization tomorrow.  Will treat with IV heparin, aspirin, low-dose beta blocker and statin therapy.  Hold ACE inhibitor for now because of soft blood pressures.

## 2014-02-03 ENCOUNTER — Encounter (HOSPITAL_COMMUNITY)
Admission: EM | Disposition: A | Discharge status: Home / Self Care | Payer: BC Managed Care – PPO | Source: Home / Self Care | Attending: Cardiology

## 2014-02-03 DIAGNOSIS — I2510 Atherosclerotic heart disease of native coronary artery without angina pectoris: Secondary | ICD-10-CM

## 2014-02-03 DIAGNOSIS — I214 Non-ST elevation (NSTEMI) myocardial infarction: Secondary | ICD-10-CM | POA: Diagnosis present

## 2014-02-03 HISTORY — PX: LEFT HEART CATHETERIZATION WITH CORONARY ANGIOGRAM: SHX5451

## 2014-02-03 LAB — LIPID PANEL
CHOL/HDL RATIO: 5.5 ratio
Cholesterol: 214 mg/dL — ABNORMAL HIGH (ref 0–200)
HDL: 39 mg/dL — AB (ref >39)
LDL Cholesterol: 153 mg/dL — ABNORMAL HIGH (ref 0–99)
Triglycerides: 109 mg/dL (ref <150)
VLDL: 22 mg/dL (ref 0–40)

## 2014-02-03 LAB — COMPREHENSIVE METABOLIC PANEL
ALBUMIN: 3.3 g/dL — AB (ref 3.5–5.2)
ALT: 28 U/L (ref 0–53)
AST: 31 U/L (ref 0–37)
Alkaline Phosphatase: 103 U/L (ref 39–117)
BUN: 14 mg/dL (ref 6–23)
CALCIUM: 8.9 mg/dL (ref 8.4–10.5)
CO2: 27 mEq/L (ref 19–32)
CREATININE: 1.18 mg/dL (ref 0.50–1.35)
Chloride: 99 mEq/L (ref 96–112)
GFR calc Af Amer: 85 mL/min — ABNORMAL LOW (ref >90)
GFR calc non Af Amer: 73 mL/min — ABNORMAL LOW (ref >90)
Glucose, Bld: 99 mg/dL (ref 70–99)
Potassium: 4.2 mEq/L (ref 3.7–5.3)
Sodium: 139 mEq/L (ref 137–147)
TOTAL PROTEIN: 6.7 g/dL (ref 6.0–8.3)
Total Bilirubin: 0.3 mg/dL (ref 0.3–1.2)

## 2014-02-03 LAB — CBC
HCT: 39.1 % (ref 39.0–52.0)
HEMOGLOBIN: 13 g/dL (ref 13.0–17.0)
MCH: 29.6 pg (ref 26.0–34.0)
MCHC: 33.2 g/dL (ref 30.0–36.0)
MCV: 89.1 fL (ref 78.0–100.0)
Platelets: 217 10*3/uL (ref 150–400)
RBC: 4.39 MIL/uL (ref 4.22–5.81)
RDW: 13.9 % (ref 11.5–15.5)
WBC: 8.5 10*3/uL (ref 4.0–10.5)

## 2014-02-03 LAB — C-REACTIVE PROTEIN: CRP: 1.2 mg/dL — ABNORMAL HIGH (ref <0.60)

## 2014-02-03 LAB — POCT ACTIVATED CLOTTING TIME: Activated Clotting Time: 585 seconds

## 2014-02-03 LAB — TROPONIN I: TROPONIN I: 0.36 ng/mL — AB (ref <0.30)

## 2014-02-03 LAB — HEMOGLOBIN A1C
HEMOGLOBIN A1C: 5.6 % (ref <5.7)
Mean Plasma Glucose: 114 mg/dL (ref <117)

## 2014-02-03 LAB — HEPARIN LEVEL (UNFRACTIONATED): HEPARIN UNFRACTIONATED: 0.16 [IU]/mL — AB (ref 0.30–0.70)

## 2014-02-03 SURGERY — LEFT HEART CATHETERIZATION WITH CORONARY ANGIOGRAM
Anesthesia: LOCAL

## 2014-02-03 MED ORDER — NITROGLYCERIN IN D5W 200-5 MCG/ML-% IV SOLN: 2.0000 ug/min | INTRAVENOUS | Status: DC

## 2014-02-03 MED ORDER — PRASUGREL HCL 10 MG PO TABS
10.0000 mg | ORAL_TABLET | Freq: Every day | ORAL | Status: DC
  Administered 2014-02-04 – 2014-02-05 (×2): 10 mg via ORAL
  Filled 2014-02-03 (×2): qty 1

## 2014-02-03 MED ORDER — MIDAZOLAM HCL 2 MG/2ML IJ SOLN
INTRAMUSCULAR | Status: AC
  Filled 2014-02-03: qty 2

## 2014-02-03 MED ORDER — SODIUM CHLORIDE 0.9 % IV SOLN
0.2500 mg/kg/h | INTRAVENOUS | Status: DC
  Filled 2014-02-03: qty 250

## 2014-02-03 MED ORDER — LIDOCAINE HCL (PF) 1 % IJ SOLN
INTRAMUSCULAR | Status: AC
  Filled 2014-02-03: qty 30

## 2014-02-03 MED ORDER — SODIUM CHLORIDE 0.9 % IV SOLN: INTRAVENOUS | Status: DC

## 2014-02-03 MED ORDER — BIVALIRUDIN 250 MG IV SOLR
INTRAVENOUS | Status: AC
  Filled 2014-02-03: qty 250

## 2014-02-03 MED ORDER — VERAPAMIL HCL 2.5 MG/ML IV SOLN
INTRAVENOUS | Status: AC
  Filled 2014-02-03: qty 2

## 2014-02-03 MED ORDER — HEPARIN SODIUM (PORCINE) 1000 UNIT/ML IJ SOLN
INTRAMUSCULAR | Status: AC
  Filled 2014-02-03: qty 1

## 2014-02-03 MED ORDER — FENTANYL CITRATE 0.05 MG/ML IJ SOLN
INTRAMUSCULAR | Status: AC
  Filled 2014-02-03: qty 2

## 2014-02-03 MED ORDER — NITROGLYCERIN 0.2 MG/ML ON CALL CATH LAB
INTRAVENOUS | Status: AC
Start: 2014-02-03 — End: 2014-02-03
  Filled 2014-02-03: qty 1

## 2014-02-03 MED ORDER — ALPRAZOLAM 0.5 MG PO TABS
0.5000 mg | ORAL_TABLET | Freq: Three times a day (TID) | ORAL | Status: DC | PRN
  Administered 2014-02-03 – 2014-02-05 (×4): 0.5 mg via ORAL
  Filled 2014-02-03 (×4): qty 1

## 2014-02-03 MED ORDER — HEPARIN (PORCINE) IN NACL 2-0.9 UNIT/ML-% IJ SOLN
INTRAMUSCULAR | Status: AC
  Filled 2014-02-03: qty 1500

## 2014-02-03 MED ORDER — HEPARIN BOLUS VIA INFUSION
3000.0000 [IU] | Freq: Once | INTRAVENOUS | Status: AC
  Administered 2014-02-03: 3000 [IU] via INTRAVENOUS
  Filled 2014-02-03: qty 3000

## 2014-02-03 NOTE — Progress Notes (Signed)
Patient ID: Brett Harper, male   DOB: 01/23/1971, 43 y.o.   MRN: [id number]  Cath today.  Jerral BonitoJeff Zyonna Vardaman, MD

## 2014-02-03 NOTE — CV Procedure (Signed)
Cardiac Catheterization Procedure Note  Name: Brett GambleWilliam D Harper MRN: [id number] DOB: 06/23/1971  Procedure: Left Heart Cath, Selective Coronary Angiography, LV angiography, PTCA and stenting of the mid LAD  Indication: Non-ST elevation myocardial infarction  Medications:  Sedation:  4 mg IV Versed, 75 mcg IV Fentanyl  Contrast:  230 ml Omnipaque  Procedural Details: The right wrist was prepped, draped, and anesthetized with 1% lidocaine. Using the modified Seldinger technique, a 5 French Slender sheath was introduced into the right radial artery. 3 mg of verapamil was administered through the sheath, weight-based unfractionated heparin was administered intravenously. A Jackie catheter was used for selective coronary angiography. A pigtail catheter was used for left ventriculography. Catheter exchanges were performed over an exchange length guidewire. There were no immediate procedural complications.  Procedural Findings:  Hemodynamics: AO:  110/87   mmHg LV:  117/13    mmHg LVEDP: 18  mmHg  Coronary angiography: Coronary dominance: Right   Left Main:  Normal  Left Anterior Descending (LAD):  Normal in size and mildly calcified. There is a 95% stenosis in the midsegment after the first septal perforator followed by a diffuse area of 80% disease.  1st diagonal (D1):  Normal in size with minor irregularities.  2nd diagonal (D2):  Normal in size with 50% ostial stenosis  3rd diagonal (D3):  Small in size with minor irregularities.  Circumflex (LCx):  Normal in size and nondominant. There is 60% ostial stenosis. The rest of the vessel has minor irregularities.  1st obtuse marginal:  Normal in size with no significant disease.  2nd obtuse marginal:  Small in size with no significant disease  3rd obtuse marginal:  Normal in size with no significant disease   Right Coronary Artery: The normal in size and dominant. There is 20% proximal stenosis.  Posterior descending artery:  Large in size with 80-90% proximal stenosis  Posterior AV segment: Normal in size with no significant disease.  Posterolateral branchs:  No significant disease  Left ventriculography: Left ventricular systolic function is mildly reduced , LVEF is estimated at 45  %, there is no significant mitral regurgitation . Mild anterior and apical hypokinesis.  PCI Note:  Following the diagnostic procedure, the decision was made to proceed with PCI.  Weight-based bivalirudin was given for anticoagulation. Once a therapeutic ACT was achieved, a 6 JamaicaFrench XB LAD 3.5 guide catheter was inserted.  A Runthrough coronary guidewire was used to cross the lesion.  The lesion was predilated with a 2.5 x 15 balloon.  I attempted to advance the wire into the second diagonal to protect the vessel. However, there was difficulty with poor visualization. The lesion was then stented with a 2.75 x 33 mm Xience drug-eluting stent.  The stent was postdilated with a 3.0 x 15 noncompliant balloon in the proximal segment.  Following PCI, there was 0% residual stenosis and TIMI-3 flow. The second diagonal was jailed by the stent with worsened ostial stenosis but TIMI 3 flow.. The patient tolerated the procedure well. There were no immediate procedural complications. A TR band was used for radial hemostasis. The patient was transferred to the post catheterization recovery area for further monitoring.  PCI Data: I will Vessel - LAD/Segment - mid Percent Stenosis (pre)  95% TIMI-flow 3 Stent : 2.75 x 33 mm Xience drug-eluting stent postdilated with a 3.0 noncompliant balloon Percent Stenosis (post) 0% TIMI-flow (post) 3   Final Conclusions:  1. Significant three-vessel coronary artery disease with 95% mid LAD stenosis, 60% ostial left  circumflex stenosis and 80-90% proximal right PDA stenosis. The culprit the LAD. 2. Mildly reduced LV systolic function with an ejection fraction of 45% and mildly elevated left ventricular  end-diastolic pressure. 3. Successful angioplasty and drug-eluting stent placement to the mid LAD. The second diagonal was jailed by the stent with worsened ostial stenosis but TIMI 3 flow. This was left to be treated medically.  Recommendations:  Dual antiplatelet therapy for at least one year. PCI of the right PDA can be staged. The patient had severe anxiety throughout the case. It was difficult to sedate him. He was having chest pain at the beginning of the diagnostic catheterization and worsened before PCI. He continued to have chest pain even after the case. He was started on nitroglycerin drip.  Lorine BearsMuhammad Arida MD, [facility] VirginiaFACC 02/03/2014, 1:43 PM

## 2014-02-03 NOTE — Progress Notes (Signed)
Pt c/o 6 of 10 chest pain to center of chest. BP 93/63 in rt arm lying. O2 applied and 12 lead EKG done. MD notified of BP and results of EKG. Orders received. Pt medicated. At 0008 pt feeling better. Cont to monitor.

## 2014-02-03 NOTE — Significant Event (Signed)
Rapid Response Event Note: Called to room 6c02 per floor RN for pt with worsening chest pain and left arm weakness/numbness. Pt from Cath lab today for PTCA and stenting of mid LAD. Ekg obtained and Nitro gtt increased to 30mcg prior to my arrival. Cardio PA [employer] paged.  Overview: Time Called: 1930 Arrival Time: 1935 Event Type: Cardiac  Initial Focused Assessment: Upon my arrival pt found resting in bed, anxious. Pt complains of conitnued numbness and weakness in left arm, unresolved since prior to Cath lab today. Alert oriented, denies chest pain, resolved since nitro gtt increased. BP 130/77, hr 93, rr 22, 97% on 4 LNC.   Interventions: Nitro gtt increased and EKG completed prior to my arrival. Left wrist PIV removed. Call back [facility] updated on pt status, will come to see patient  Event Summary: Name of Physician Notified: Engles Cardio PA at 1945    at    Outcome: Stayed in room and stabalized     Brett Harper, Brett IncBrooke Leigh Jyla Harper

## 2014-02-03 NOTE — Progress Notes (Signed)
ANTICOAGULATION CONSULT NOTE   Pharmacy Consult for Heparin Indication: chest pain/ACS  Allergies  Allergen Reactions  . Naproxen Other (See Comments)    bleeding    Patient Measurements: Height: 5\' 11"  (180.3 cm) Weight: 338 lb 8 oz (153.543 kg) IBW/kg (Calculated) : 75.3 Heparin Dosing Weight: 109 kg  Vital Signs: Temp: 98.1 F (36.7 C) (06/30 1949) Temp src: Oral (06/30 1949) BP: 102/64 mmHg (06/30 2130) Pulse Rate: 80 (06/30 2130)  Labs:  Recent Labs  02/02/14 0815 02/02/14 0843 02/02/14 2142 02/03/14 0105  HGB 13.7 14.6  --  13.0  HCT 41.0 43.0  --  39.1  PLT 208  --   --  217  LABPROT  --   --  13.4  --   INR  --   --  1.02  --   HEPARINUNFRC  --   --   --  0.16*  CREATININE  --  1.10  --  1.18  TROPONINI  --   --  0.45* 0.36*    Estimated Creatinine Clearance: 119.2 ml/min (by C-G formula based on Cr of 1.18).  Assessment: 43 y.o. male with chest pain for heparin   Goal of Therapy:  Heparin level 0.3-0.7 units/ml Monitor platelets by anticoagulation protocol: Yes   Plan:  Heparin 3000 units IV bolus, then increase heparin 1800 units/hr F/U after cath  Geannie RisenGreg Benen Weida, PharmD, BCPS  02/03/2014,3:36 AM

## 2014-02-03 NOTE — Progress Notes (Signed)
ANTICOAGULATION CONSULT NOTE   Pharmacy Consult for angiomax Indication: chest pain/ACS  Allergies  Allergen Reactions  . Naproxen Other (See Comments)    bleeding    Patient Measurements: Height: 5\' 11"  (180.3 cm) Weight: 338 lb 8 oz (153.543 kg) IBW/kg (Calculated) : 75.3 Heparin Dosing Weight: 109 kg  Vital Signs: Temp: 98.1 F (36.7 C) (07/01 0616) Temp src: Oral (07/01 0616) BP: 112/67 mmHg (07/01 0616) Pulse Rate: 78 (07/01 1218)  Labs:  Recent Labs  02/02/14 0815 02/02/14 0843 02/02/14 2142 02/03/14 0105 02/03/14 0825  HGB 13.7 14.6  --  13.0  --   HCT 41.0 43.0  --  39.1  --   PLT 208  --   --  217  --   LABPROT  --   --  13.4  --   --   INR  --   --  1.02  --   --   HEPARINUNFRC  --   --   --  0.16*  --   CREATININE  --  1.10  --  1.18  --   TROPONINI  --   --  0.45* 0.36* <0.30    Estimated Creatinine Clearance: 119.2 ml/min (by C-G formula based on Cr of 1.18).  Assessment: 43 y.o. male with chest pain. S/p PCI. Pt was started on angiomax in the cath lab. Plan was to cont until bag is empty post cath.   Plan  Cont angiomax at current rate until empty

## 2014-02-03 NOTE — Accreditation Note (Signed)
TR BAND REMOVAL  LOCATION:    right radial  DEFLATED PER PROTOCOL:    Yes.    TIME BAND OFF / DRESSING APPLIED:    1900   SITE UPON ARRIVAL:    Level 0  SITE AFTER BAND REMOVAL:    Level 0  REVERSE ALLEN'S TEST:     positive  CIRCULATION SENSATION AND MOVEMENT:    Within Normal Limits   Yes.    COMMENTS:   Tolerated procedure well 

## 2014-02-03 NOTE — Progress Notes (Signed)
Called by RN, pt with 5/10 chest pain. IV NTG now at 30 mcg/mn.  EKG withut acute changes, Dr.Arida notified and he reviewed EKG.  Pt also complained of Lt arm numbness that has never resolved.  Lt hand weakness rapid response called, his pain had resolved on their exam and we will remove IV of lt wrist and re-evaluate.  His symptoms are resolving.  He does have increased anxiety, xanax ordered.

## 2014-02-03 NOTE — Interval H&P Note (Signed)
Cath Lab Visit (complete for each Cath Lab visit)  Clinical Evaluation Leading to the Procedure:   ACS: Yes.    Non-ACS:    Anginal Classification: CCS IV  Anti-ischemic medical therapy: Minimal Therapy (1 class of medications)  Non-Invasive Test Results: No non-invasive testing performed  Prior CABG: No previous CABG      History and Physical Interval Note:  02/03/2014 12:15 PM  Brett Harper  has presented today for surgery, with the diagnosis of cp  The various methods of treatment have been discussed with the patient and family. After consideration of risks, benefits and other options for treatment, the patient has consented to  Procedure(s): LEFT HEART CATHETERIZATION WITH CORONARY ANGIOGRAM (N/A) as a surgical intervention .  The patient's history has been reviewed, patient examined, no change in status, stable for surgery.  I have reviewed the patient's chart and labs.  Questions were answered to the patient's satisfaction.     Lorine BearsMuhammad Arida

## 2014-02-03 NOTE — Progress Notes (Signed)
UR Completed.  [name] Jane [phone] 02/03/2014  

## 2014-02-04 ENCOUNTER — Encounter (HOSPITAL_COMMUNITY)
Admission: EM | Disposition: A | Discharge status: Home / Self Care | Payer: BC Managed Care – PPO | Source: Home / Self Care | Attending: Cardiology

## 2014-02-04 DIAGNOSIS — F419 Anxiety disorder, unspecified: Secondary | ICD-10-CM | POA: Diagnosis present

## 2014-02-04 DIAGNOSIS — E785 Hyperlipidemia, unspecified: Secondary | ICD-10-CM | POA: Diagnosis present

## 2014-02-04 DIAGNOSIS — I2510 Atherosclerotic heart disease of native coronary artery without angina pectoris: Secondary | ICD-10-CM

## 2014-02-04 DIAGNOSIS — Z8249 Family history of ischemic heart disease and other diseases of the circulatory system: Secondary | ICD-10-CM

## 2014-02-04 DIAGNOSIS — R9431 Abnormal electrocardiogram [ECG] [EKG]: Secondary | ICD-10-CM

## 2014-02-04 HISTORY — PX: LEFT HEART CATHETERIZATION WITH CORONARY ANGIOGRAM: SHX5451

## 2014-02-04 LAB — CBC
HEMATOCRIT: 38.4 % — AB (ref 39.0–52.0)
HEMOGLOBIN: 12.5 g/dL — AB (ref 13.0–17.0)
MCH: 28.9 pg (ref 26.0–34.0)
MCHC: 32.6 g/dL (ref 30.0–36.0)
MCV: 88.9 fL (ref 78.0–100.0)
Platelets: 192 10*3/uL (ref 150–400)
RBC: 4.32 MIL/uL (ref 4.22–5.81)
RDW: 13.7 % (ref 11.5–15.5)
WBC: 8.6 10*3/uL (ref 4.0–10.5)

## 2014-02-04 LAB — BASIC METABOLIC PANEL
Anion gap: 12 (ref 5–15)
BUN: 12 mg/dL (ref 6–23)
CO2: 23 mEq/L (ref 19–32)
Calcium: 8.8 mg/dL (ref 8.4–10.5)
Chloride: 104 mEq/L (ref 96–112)
Creatinine, Ser: 1.05 mg/dL (ref 0.50–1.35)
GFR calc Af Amer: >90 mL/min (ref >90)
GFR, EST NON AFRICAN AMERICAN: 84 mL/min — AB (ref >90)
GLUCOSE: 90 mg/dL (ref 70–99)
POTASSIUM: 4.2 meq/L (ref 3.7–5.3)
Sodium: 139 mEq/L (ref 137–147)

## 2014-02-04 LAB — TROPONIN I: Troponin I: 1.25 ng/mL (ref <0.30)

## 2014-02-04 LAB — POCT ACTIVATED CLOTTING TIME: Activated Clotting Time: 275 seconds

## 2014-02-04 SURGERY — LEFT HEART CATHETERIZATION WITH CORONARY ANGIOGRAM
Anesthesia: LOCAL

## 2014-02-04 MED ORDER — VERAPAMIL HCL 2.5 MG/ML IV SOLN
INTRAVENOUS | Status: AC
  Filled 2014-02-04: qty 2

## 2014-02-04 MED ORDER — ACETAMINOPHEN 325 MG PO TABS
650.0000 mg | ORAL_TABLET | ORAL | Status: DC | PRN
  Administered 2014-02-04: 21:00:00 650 mg via ORAL
  Filled 2014-02-04: qty 2

## 2014-02-04 MED ORDER — FENTANYL CITRATE 0.05 MG/ML IJ SOLN
INTRAMUSCULAR | Status: AC
  Filled 2014-02-04: qty 2

## 2014-02-04 MED ORDER — SODIUM CHLORIDE 0.9 % IV SOLN
INTRAVENOUS | Status: AC
  Administered 2014-02-04: 13:00:00 via INTRAVENOUS

## 2014-02-04 MED ORDER — MIDAZOLAM HCL 2 MG/2ML IJ SOLN
INTRAMUSCULAR | Status: AC
  Filled 2014-02-04: qty 2

## 2014-02-04 MED ORDER — SODIUM CHLORIDE 0.9 % IV SOLN: 250.0000 mL | INTRAVENOUS | Status: DC | PRN

## 2014-02-04 MED ORDER — ONDANSETRON HCL 4 MG/2ML IJ SOLN
4.0000 mg | Freq: Four times a day (QID) | INTRAMUSCULAR | Status: DC | PRN
  Administered 2014-02-04: 4 mg via INTRAVENOUS
  Filled 2014-02-04: qty 2

## 2014-02-04 MED ORDER — ZOLPIDEM TARTRATE 5 MG PO TABS: 5.0000 mg | ORAL_TABLET | Freq: Every evening | ORAL | Status: DC | PRN

## 2014-02-04 MED ORDER — SODIUM CHLORIDE 0.9 % IJ SOLN: 3.0000 mL | Freq: Two times a day (BID) | INTRAMUSCULAR | Status: DC

## 2014-02-04 MED ORDER — LIDOCAINE HCL (PF) 1 % IJ SOLN
INTRAMUSCULAR | Status: AC
  Filled 2014-02-04: qty 30

## 2014-02-04 MED ORDER — SODIUM CHLORIDE 0.9 % IJ SOLN: 3.0000 mL | INTRAMUSCULAR | Status: DC | PRN

## 2014-02-04 MED ORDER — NITROGLYCERIN 0.2 MG/ML ON CALL CATH LAB
INTRAVENOUS | Status: AC
  Filled 2014-02-04: qty 1

## 2014-02-04 MED ORDER — HEPARIN SODIUM (PORCINE) 1000 UNIT/ML IJ SOLN
INTRAMUSCULAR | Status: AC
  Filled 2014-02-04: qty 1

## 2014-02-04 MED ORDER — OXYCODONE HCL 5 MG PO TABS
5.0000 mg | ORAL_TABLET | ORAL | Status: DC | PRN
  Administered 2014-02-04 – 2014-02-05 (×2): 5 mg via ORAL
  Filled 2014-02-04: qty 1

## 2014-02-04 MED ORDER — ASPIRIN 81 MG PO CHEW
81.0000 mg | CHEWABLE_TABLET | ORAL | Status: AC
  Administered 2014-02-04: 81 mg via ORAL
  Filled 2014-02-04 (×2): qty 1

## 2014-02-04 MED ORDER — SODIUM CHLORIDE 0.9 % IV SOLN: INTRAVENOUS | Status: DC

## 2014-02-04 MED ORDER — HEPARIN (PORCINE) IN NACL 2-0.9 UNIT/ML-% IJ SOLN
INTRAMUSCULAR | Status: AC
  Filled 2014-02-04: qty 1500

## 2014-02-04 MED ORDER — OXYCODONE-ACETAMINOPHEN 5-325 MG PO TABS
1.0000 | ORAL_TABLET | ORAL | Status: DC | PRN
  Administered 2014-02-05: 1 via ORAL
  Filled 2014-02-04: qty 1

## 2014-02-04 MED FILL — Sodium Chloride IV Soln 0.9%: INTRAVENOUS | Qty: 50 | Status: AC

## 2014-02-04 NOTE — Progress Notes (Signed)
TR BAND REMOVAL  LOCATION:  right radial  DEFLATED PER PROTOCOL:  Yes.    TIME BAND OFF / DRESSING APPLIED:   1600   SITE UPON ARRIVAL:   Level 0  SITE AFTER BAND REMOVAL:  Level 0  REVERSE ALLEN'S TEST:    positive  CIRCULATION SENSATION AND MOVEMENT:  Within Normal Limits  Yes.    COMMENTS:    

## 2014-02-04 NOTE — Care Management Note (Addendum)
  Page 1 of 1   02/05/2014     1:20:55 PM CARE MANAGEMENT NOTE 02/05/2014  Patient:  Augusto GambleWILSON,Zaccary D   Account Number:  [id number]  Date Initiated:  02/04/2014  Documentation initiated by:  Jamira Barfuss  Subjective/Objective Assessment:   CP     Action/Plan:   CM to follow for dispositon needs   Anticipated DC Date:  02/04/2014   Anticipated DC Plan:  HOME/SELF CARE      DC Planning Services  CM consult  Medication Assistance      Choice offered to / List presented to:             Status of service:  Completed, signed off Medicare Important Message given?  YES (If response is "NO", the following Medicare IM given date fields will be blank) Date Medicare IM given:  02/05/2014 Medicare IM given by:   Date Additional Medicare IM given:   Additional Medicare IM given by:    Discharge Disposition:  HOME/SELF CARE  Per UR Regulation:  Reviewed for med. necessity/level of care/duration of stay  If discussed at Long Length of Stay Meetings, dates discussed:    Comments:  Donato SchultzCrystal Jezabel Lecker RN, BSN, MSHL, CCM -  [place] [phone] ---02/04/2014 1016 by Donato SchultzRYSTAL Laiken Sandy--- Benefits Check Effient 10mg  daily Coverage, co-pay, authorization, deductibles, preferred pharm. Per update:  ---02/05/2014 1032 by NIA Imogene BurnSHEALY--- INSURANCE COMPANY IS CLOSED Hx/o BCBS PPO [facility].

## 2014-02-04 NOTE — Progress Notes (Signed)
    Subjective:  He continues to complain of chest pressure and Lt arm pain. His arm pain didn't start till yesterday, not a chronic issue. NTG helped his chest pain but IV NTG turned off secondary to low B/P. His EKG has significant TWI 1, AVL, V2.   Objective:  Vital Signs in the last 24 hours: Temp:  [97.6 F (36.4 C)-99 F (37.2 C)] 99 F (37.2 C) (07/02 0609) Pulse Rate:  [68-96] 68 (07/02 0609) Resp:  [15-18] 18 (07/02 0609) BP: (81-142)/(40-84) 97/62 mmHg (07/02 0609) SpO2:  [97 %-99 %] 97 % (07/02 0609) Weight:  [339 lb 8.1 oz (154 kg)] 339 lb 8.1 oz (154 kg) (07/02 0007)  Intake/Output from previous day:  Intake/Output Summary (Last 24 hours) at 02/04/14 0649 Last data filed at 02/04/14 [id number]  Gross per 24 hour  Intake      3 ml  Output   2225 ml  Net  -2222 ml    Physical Exam: General appearance: alert, cooperative, no distress, morbidly obese and anxious Lungs: clear to auscultation bilaterally Heart: regular rate and rhythm Extremities: no hematoma Rt wrist   Rate: 68  Rhythm: normal sinus rhythm  Lab Results:  Recent Labs  02/03/14 0105 02/04/14 0325  WBC 8.5 8.6  HGB 13.0 12.5*  PLT 217 192    Recent Labs  02/03/14 0105 02/04/14 0325  NA 139 139  K 4.2 4.2  CL 99 104  CO2 27 23  GLUCOSE 99 90  BUN 14 12  CREATININE 1.18 1.05    Recent Labs  02/03/14 0105 02/03/14 0825  TROPONINI 0.36* <0.30    Recent Labs  02/02/14 2142  INR 1.02    Imaging: Imaging results have been reviewed  Cardiac Studies:  Assessment/Plan:  43 y.o. male with a history of obesity, pulmonary sarcoidosis, and HTN. Admitted 02/02/14 with chest pain and arm pain. Troponin peaked at 0.45. Cath 02/03/14 revealed 3V CAD with 95% LAD, 80-90% PDA, 60% CFX. He recieved an LAD DES with plans for staged PDA PCI later. Post PCI he has had continued chest and arm pain, as well as anxiety.    Principal Problem:   NSTEMI (non-ST elevated myocardial infarction) Active  Problems:   Chest pain   Dyslipidemia   Anxiety   Hypertension   Pulmonary sarcoidosis   Obesity- BMI 47   Family history of coronary artery disease in father    PLAN: Check Troponin this am. Will review with MD- he may need to go back to the lab, will keep NPO. Hold on resuming IV NTG as his B/P is still low. Will stop ACE for now with low B/P.   Corine ShelterLuke Kilroy PA-C [place] [phone] 02/04/2014, 6:49 AM   I have personally seen and examined this patient with Corine ShelterLuke Kilroy, PA-C. With ongoing chest pain overnight, will plan cath today to re-look LAD stent and plan PCI of the PDA. I agree with the assessment and plan as outlined above.   MCALHANY,CHRISTOPHER 02/04/2014 7:23 AM

## 2014-02-04 NOTE — Interval H&P Note (Signed)
History and Physical Interval Note:  02/04/2014 11:15 AM  Brett GambleWilliam D Harper  has presented today for cardiac cath with the diagnosis of cp/CAD.  The various methods of treatment have been discussed with the patient and family. After consideration of risks, benefits and other options for treatment, the patient has consented to  Procedure(s): LEFT HEART CATHETERIZATION WITH CORONARY ANGIOGRAM (N/A) as a surgical intervention .  The patient's history has been reviewed, patient examined, no change in status, stable for surgery.  I have reviewed the patient's chart and labs.  Questions were answered to the patient's satisfaction.    Cath Lab Visit (complete for each Cath Lab visit)  Clinical Evaluation Leading to the Procedure:   ACS: Yes  Non-ACS:    Anginal Classification: CCS IV  Anti-ischemic medical therapy: Maximal Therapy (2 or more classes of medications)  Non-Invasive Test Results: No non-invasive testing performed  Prior CABG: No previous CABG        Philomena Buttermore

## 2014-02-04 NOTE — Progress Notes (Signed)
Patient still on bedrest. Reviewed MI booklet, stent card. Patient given effiant booklet with drug card. I went over exercise instructions with Mr Brett Harper for discharge. Temperature precautions, end points of exercise discussed. Mr Brett Harper is interested in outpatient cardiac rehab. Permission granted to contact the patient at home. Heart healthy diet sublingual nitroglycerin and when to call [phone] discussed.

## 2014-02-04 NOTE — Progress Notes (Signed)
UR completed Layn Kye K. Kreig Parson, RN, BSN, MSHL, CCM  02/04/2014 11:28 AM

## 2014-02-04 NOTE — H&P (View-Only) (Signed)
    Subjective:  He continues to complain of chest pressure and Lt arm pain. His arm pain didn't start till yesterday, not a chronic issue. NTG helped his chest pain but IV NTG turned off secondary to low B/P. His EKG has significant TWI 1, AVL, V2.   Objective:  Vital Signs in the last 24 hours: Temp:  [97.6 F (36.4 C)-99 F (37.2 C)] 99 F (37.2 C) (07/02 0609) Pulse Rate:  [68-96] 68 (07/02 0609) Resp:  [15-18] 18 (07/02 0609) BP: (81-142)/(40-84) 97/62 mmHg (07/02 0609) SpO2:  [97 %-99 %] 97 % (07/02 0609) Weight:  [339 lb 8.1 oz (154 kg)] 339 lb 8.1 oz (154 kg) (07/02 0007)  Intake/Output from previous day:  Intake/Output Summary (Last 24 hours) at 02/04/14 0649 Last data filed at 02/04/14 0610  Gross per 24 hour  Intake      3 ml  Output   2225 ml  Net  -2222 ml    Physical Exam: General appearance: alert, cooperative, no distress, morbidly obese and anxious Lungs: clear to auscultation bilaterally Heart: regular rate and rhythm Extremities: no hematoma Rt wrist   Rate: 68  Rhythm: normal sinus rhythm  Lab Results:  Recent Labs  02/03/14 0105 02/04/14 0325  WBC 8.5 8.6  HGB 13.0 12.5*  PLT 217 192    Recent Labs  02/03/14 0105 02/04/14 0325  NA 139 139  K 4.2 4.2  CL 99 104  CO2 27 23  GLUCOSE 99 90  BUN 14 12  CREATININE 1.18 1.05    Recent Labs  02/03/14 0105 02/03/14 0825  TROPONINI 0.36* <0.30    Recent Labs  02/02/14 2142  INR 1.02    Imaging: Imaging results have been reviewed  Cardiac Studies:  Assessment/Plan:  43 y.o. male with a history of obesity, pulmonary sarcoidosis, and HTN. Admitted 02/02/14 with chest pain and arm pain. Troponin peaked at 0.45. Cath 02/03/14 revealed 3V CAD with 95% LAD, 80-90% PDA, 60% CFX. He recieved an LAD DES with plans for staged PDA PCI later. Post PCI he has had continued chest and arm pain, as well as anxiety.    Principal Problem:   NSTEMI (non-ST elevated myocardial infarction) Active  Problems:   Chest pain   Dyslipidemia   Anxiety   Hypertension   Pulmonary sarcoidosis   Obesity- BMI 47   Family history of coronary artery disease in father    PLAN: Check Troponin this am. Will review with MD- he may need to go back to the lab, will keep NPO. Hold on resuming IV NTG as his B/P is still low. Will stop ACE for now with low B/P.   Luke Kilroy PA-C Beeper [phone] 02/04/2014, 6:49 AM   I have personally seen and examined this patient with Luke Kilroy, PA-C. With ongoing chest pain overnight, will plan cath today to re-look LAD stent and plan PCI of the PDA. I agree with the assessment and plan as outlined above.   MCALHANY,CHRISTOPHER 02/04/2014 7:23 AM  

## 2014-02-04 NOTE — CV Procedure (Signed)
Cardiac Catheterization Operative Report  Brett GambleWilliam D Borsuk [id number] 7/2/201512:19 PM Estanislado PandySASSER,PAUL W, MD  Procedure Performed:  1. Selective Coronary Angiography 2. PTCA/DES x 1 PDA  Operator: Verne Carrowhristopher McAlhany, MD  Arterial access site:  Right radial artery  Indication: 43 yo male with history of CAD admitted with  NSTEMI on 02/02/14, s/p cath 02/03/14 per Dr. Kirke CorinArida and found to have severe stenosis mid LAD treated with DES x 1. He was also found to have a severe stenosis in the PDA. He has had chest pain overnight. EKG changes this am.                                   Procedure Details: The risks, benefits, complications, treatment options, and expected outcomes were discussed with the patient. The patient and/or family concurred with the proposed plan, giving informed consent. The patient was brought to the cath lab after IV hydration was begun and oral premedication was given. The patient was further sedated with Versed and Fentanyl. The right wrist was assessed with a reverse Allens test which was positive. The right wrist was prepped and draped in a sterile fashion. 1% lidocaine was used for local anesthesia. Using the modified Seldinger access technique, a 5/6 French sheath was placed in the right radial artery. 3 mg Verapamil was given through the sheath. 11000 units IV heparin was given. Standard diagnostic catheters were used to perform selective coronary angiography. I elected to perform PCI of the PDA.   PCI Note: The RCA was engaged with a ICARI 1.5 guide. ACT was 275 with heparin bolus. He had been loaded with Effient yesterday and received ASA/Effient this am. I passed a Cougar IC wire down the RCA into the PDA. A 2.0 x 15 mm balloon was used to pre-dilate the stenosis. A 2.5 x 23 mm Xience DES was deployed in the PDA. The stent was post-dilated with a 2.75 x 15 mm Alvarado balloon x 1. The stenosis was taken from 95% down to 0%. The sheath was removed from the right radial  artery and a Terumo hemostasis band was applied at the arteriotomy site on the right wrist. There were no immediate complications. The patient was taken to the recovery area in stable condition.   Hemodynamic Findings: Central aortic pressure: 125/86  Angiographic Findings:  Left main: No obstructive disease.   Left Anterior Descending Artery: Large caliber vessel that courses to the apex. The mid stented segment is patent without restenosis. The first diagonal is moderate in caliber and is patent without stenosis. The second diagonal branch is jailed by the LAD stent with ostial 70% narrowing but excellent flow.   Circumflex Artery: Large caliber vessel with ostial 60% stenosis. The first obtuse marginal branch is moderate in caliber with diffuse 30% stenosis. The second obtuse marginal branch is a moderate caliber bifurcating vessel with mild plaque.   Right Coronary Artery: Large dominant vessel with 20% proximal and mid stenosis. The PDA is moderate caliber vessel with 90-95% stenosis.   Left Ventricular Angiogram: Deferred.   Impression: 1. Double vessel CAD with patent LAD stent, severe PDA stenosis 2. Ongoing chest pain 3. Successful PTCA/DES x 1 PDA  Recommendations: Will continue ASA/Effient for at least one year. Continue statin, beta blocker. Likely d/c home in am.        Complications:  None. The patient tolerated the procedure well.

## 2014-02-05 DIAGNOSIS — I200 Unstable angina: Secondary | ICD-10-CM

## 2014-02-05 DIAGNOSIS — F411 Generalized anxiety disorder: Secondary | ICD-10-CM

## 2014-02-05 DIAGNOSIS — I2510 Atherosclerotic heart disease of native coronary artery without angina pectoris: Secondary | ICD-10-CM

## 2014-02-05 LAB — CBC
HCT: 38.3 % — ABNORMAL LOW (ref 39.0–52.0)
Hemoglobin: 12.5 g/dL — ABNORMAL LOW (ref 13.0–17.0)
MCH: 29.2 pg (ref 26.0–34.0)
MCHC: 32.6 g/dL (ref 30.0–36.0)
MCV: 89.5 fL (ref 78.0–100.0)
PLATELETS: 179 10*3/uL (ref 150–400)
RBC: 4.28 MIL/uL (ref 4.22–5.81)
RDW: 13.8 % (ref 11.5–15.5)
WBC: 7.9 10*3/uL (ref 4.0–10.5)

## 2014-02-05 LAB — BASIC METABOLIC PANEL
ANION GAP: 13 (ref 5–15)
BUN: 11 mg/dL (ref 6–23)
CALCIUM: 8.9 mg/dL (ref 8.4–10.5)
CO2: 25 mEq/L (ref 19–32)
Chloride: 103 mEq/L (ref 96–112)
Creatinine, Ser: 1.06 mg/dL (ref 0.50–1.35)
GFR calc non Af Amer: 83 mL/min — ABNORMAL LOW (ref >90)
Glucose, Bld: 88 mg/dL (ref 70–99)
Potassium: 4.5 mEq/L (ref 3.7–5.3)
Sodium: 141 mEq/L (ref 137–147)

## 2014-02-05 MED ORDER — NITROGLYCERIN 0.4 MG SL SUBL: 0.4000 mg | SUBLINGUAL_TABLET | SUBLINGUAL | Status: AC | PRN

## 2014-02-05 MED ORDER — PRASUGREL HCL 10 MG PO TABS: 10.0000 mg | ORAL_TABLET | Freq: Every day | ORAL | Status: DC

## 2014-02-05 MED ORDER — ALPRAZOLAM 0.5 MG PO TABS: 0.5000 mg | ORAL_TABLET | Freq: Three times a day (TID) | ORAL | Status: DC | PRN

## 2014-02-05 MED ORDER — ATORVASTATIN CALCIUM 80 MG PO TABS: 80.0000 mg | ORAL_TABLET | Freq: Every day | ORAL | Status: DC

## 2014-02-05 MED ORDER — ASPIRIN 81 MG PO TBEC: 81.0000 mg | DELAYED_RELEASE_TABLET | Freq: Every day | ORAL | Status: AC

## 2014-02-05 MED ORDER — METOPROLOL SUCCINATE ER 25 MG PO TB24: 25.0000 mg | ORAL_TABLET | Freq: Every day | ORAL | Status: DC

## 2014-02-05 NOTE — Discharge Instructions (Signed)
PLEASE REMEMBER TO BRING ALL OF YOUR MEDICATIONS TO EACH OF YOUR FOLLOW-UP OFFICE VISITS. ° °PLEASE ATTEND ALL SCHEDULED FOLLOW-UP APPOINTMENTS.  ° °Activity: Increase activity slowly as tolerated. You may shower, but no soaking baths (or swimming) for 1 week. No driving for 1 week. No lifting over 5 lbs for 2 weeks. No sexual activity for 1 week.  ° °You May Return to Work: in 3 weeks (if applicable) ° °Wound Care: You may wash cath site gently with soap and water. Keep cath site clean and dry. If you notice pain, swelling, bleeding or pus at your cath site, please call [phone]. ° ° ° °Cardiac Cath Site Care °Refer to this sheet in the next few weeks. These instructions provide you with information on caring for yourself after your procedure. Your caregiver may also give you more specific instructions. Your treatment has been planned according to current medical practices, but problems sometimes occur. Call your caregiver if you have any problems or questions after your procedure. °HOME CARE INSTRUCTIONS °· You may shower 24 hours after the procedure. Remove the bandage (dressing) and gently wash the site with plain soap and water. Gently pat the site dry.  °· Do not apply powder or lotion to the site.  °· Do not sit in a bathtub, swimming pool, or whirlpool for 5 to 7 days.  °· No bending, squatting, or lifting anything over 10 pounds (4.5 kg) as directed by your caregiver.  °· Inspect the site at least twice daily.  °· Do not drive home if you are discharged the same day of the procedure. Have someone else drive you.  °· You may drive 24 hours after the procedure unless otherwise instructed by your caregiver.  °What to expect: °· Any bruising will usually fade within 1 to 2 weeks.  °· Blood that collects in the tissue (hematoma) may be painful to the touch. It should usually decrease in size and tenderness within 1 to 2 weeks.  °SEEK IMMEDIATE MEDICAL CARE IF: °· You have unusual pain at the site or down the  affected limb.  °· You have redness, warmth, swelling, or pain at the site.  °· You have drainage (other than a small amount of blood on the dressing).  °· You have chills.  °· You have a fever or persistent symptoms for more than 72 hours.  °· You have a fever and your symptoms suddenly get worse.  °· Your leg becomes pale, cool, tingly, or numb.  °· You have heavy bleeding from the site. Hold pressure on the site.  °Document Released: 08/25/2010 Document Revised: 07/12/2011 Document Reviewed:  ° °

## 2014-02-05 NOTE — Discharge Summary (Signed)
CARDIOLOGY DISCHARGE SUMMARY   Patient ID: Brett Harper MRN: [id number] DOB/AGE: [birth year] 45 y.o.  Admit date: 02/02/2014 Discharge date: 02/05/2014  PCP: Estanislado Pandy, MD Primary Cardiologist: Dr. Mayford Knife  Primary Discharge Diagnosis:    NSTEMI (non-ST elevated myocardial infarction) - 2.75 x 33 mm Xience DES to the LAD, 2.5 x 23 mm Xience DES to the PDA  Secondary Discharge Diagnosis:    Hypertension   Pulmonary sarcoidosis   Morbid Obesity- BMI 47   Chest pain   Dyslipidemia   Family history of coronary artery disease in father   Anxiety   Coronary atherosclerosis of native coronary artery   Procedures: Left Heart Cath, Selective Coronary Angiography, LV angiography, PTCA and stenting of the mid LAD, relook catheterization with PTCA and stenting to the PDA, CT angiogram of the chest   Hospital Course: Brett Harper is a 43 y.o. male with no history of CAD. He has multiple cardiac risk factors. He developed chest pain that went down his left arm. He called EMS who gave him nitroglycerin and aspirin with improvement in his symptoms. His initial point-of-care troponin was elevated. He was placed on aspirin, heparin, beta blocker and a statin. His blood pressure was too low for an ACE inhibitor. He was admitted for further evaluation and treatment.  He had elevation in his cardiac enzymes, indicating a non-STEMI. He was taken to the cath lab on 07/01. Full results are below, he had a drug-eluting stent to the LAD, felt to be the culprit lesion. The second diagonal had a 50% ostial stenosis that was jailed by the stent, but still had TIMI 3 flow. It will be treated medically. He has a 90% PDA which can be treated medically for now, with staged intervention as an outpatient in the future. He had significant chest pain during and after the procedure and was admitted to ICU.  He continued to have chest pain and left arm numbness. His ECG did not show any acute changes. He  continued to have symptoms overnight and some ECG changes were noted on 07/02. He was taken back to the cath lab on 07/02.  Results are below. The PDA was felt to be the culprit lesion and he had a drug-eluting stent, reducing the stenosis to 0. His symptoms began to improve.  He was seen by cardiac rehabilitation and educated on MI restrictions, stent restrictions, heart-healthy lifestyle modifications and exercise guidelines. It is hoped he will followup with rehabilitation as an outpatient.  On 07/03 he was seen by Dr. Clifton James and all data were reviewed. He is to be on aspirin and Effient 4 year. He is to continue a statin and beta blocker. His blood pressure is well-controlled. He will not be on an ACE inhibitor. He was noted to have severe anxiety, and will get a short-term prescription for Xanax. He is to followup with primary care for this. He will followup with cardiology in [place]. No further inpatient workup is indicated and he is considered stable for discharge, to follow up as an outpatient.  Labs:   Lab Results  Component Value Date   WBC 7.9 02/05/2014   HGB 12.5* 02/05/2014   HCT 38.3* 02/05/2014   MCV 89.5 02/05/2014   PLT 179 02/05/2014    Recent Labs Lab 02/03/14 0105  02/05/14 0336  NA 139  < > 141  K 4.2  < > 4.5  CL 99  < > 103  CO2 27  < > 25  BUN 14  < > 11  CREATININE 1.18  < > 1.06  CALCIUM 8.9  < > 8.9  PROT 6.7  --   --   BILITOT 0.3  --   --   ALKPHOS 103  --   --   ALT 28  --   --   AST 31  --   --   GLUCOSE 99  < > 88  < > = values in this interval not displayed.  Troponin I  Date Value Ref Range Status  02/04/2014 1.25* <0.30 ng/mL Final  02/03/2014 <0.30  <0.30 ng/mL Final  02/03/2014 0.36* <0.30 ng/mL Final  02/02/2014 0.45* <0.30 ng/mL Final   Lipid Panel     Component Value Date/Time   CHOL 214* 02/03/2014 0105   TRIG 109 02/03/2014 0105   HDL 39* 02/03/2014 0105   CHOLHDL 5.5 02/03/2014 0105   VLDL 22 02/03/2014 0105   LDLCALC 153* 02/03/2014 0105     Recent Labs  02/02/14 2142  INR 1.02   Lab Results  Component Value Date   HGBA1C 5.6 02/02/2014      Radiology: Dg Chest 2 View 02/02/2014   CLINICAL DATA:  Chest pain  EXAM: CHEST  2 VIEW  COMPARISON:  01/26/2010  FINDINGS: Cardiac shadow is stable. The lungs are poorly aerated. No focal infiltrate or sizable effusion is noted. No acute bony abnormality is seen.  IMPRESSION: Poor inspiratory effort.  No focal confluent infiltrate is noted.   Electronically Signed   By: Alcide CleverMark  Lukens M.D.   On: 02/02/2014 09:15   Ct Angio Chest Pe W/cm &/or Wo Cm 02/02/2014   CLINICAL DATA:  Short of breath and chest pain.  Elevated D-dimer  EXAM: CT ANGIOGRAPHY CHEST WITH CONTRAST  TECHNIQUE: Multidetector CT imaging of the chest was performed using the standard protocol during bolus administration of intravenous contrast. Multiplanar CT image reconstructions and MIPs were obtained to evaluate the vascular anatomy.  CONTRAST:  85mL OMNIPAQUE IOHEXOL 350 MG/ML SOLN  COMPARISON:  CXR 02/02/2014  FINDINGS: Negative for pulmonary embolism. Negative for aortic aneurysm or dissection.  Coronary artery calcification. No pericardial effusion. Heart size upper normal.  The lungs are clear. Negative for pneumonia or edema. No pleural effusion. Negative for mass or adenopathy.  Review of the MIP images confirms the above findings.  IMPRESSION: Negative for pulmonary embolism.  No acute abnormality.   Electronically Signed   By: Marlan Palauharles  Clark M.D.   On: 02/02/2014 14:50    Cardiac Cath: 02/03/2014 Coronary angiography:  Coronary dominance: Right  Left Main: Normal  Left Anterior Descending (LAD): Normal in size and mildly calcified. There is a 95% stenosis in the midsegment after the first septal perforator followed by a diffuse area of 80% disease.  1st diagonal (D1): Normal in size with minor irregularities.  2nd diagonal (D2): Normal in size with 50% ostial stenosis  3rd diagonal (D3): Small in size with minor  irregularities.  Circumflex (LCx): Normal in size and nondominant. There is 60% ostial stenosis. The rest of the vessel has minor irregularities.  1st obtuse marginal: Normal in size with no significant disease.  2nd obtuse marginal: Small in size with no significant disease  3rd obtuse marginal: Normal in size with no significant disease  Right Coronary Artery: The normal in size and dominant. There is 20% proximal stenosis.  Posterior descending artery: Large in size with 80-90% proximal stenosis  Posterior AV segment: Normal in size with no significant disease.  Posterolateral branchs: No  significant disease Left ventriculography: Left ventricular systolic function is mildly reduced , LVEF is estimated at 45 %, there is no significant mitral regurgitation . Mild anterior and apical hypokinesis.  PCI Note: Following the diagnostic procedure, the decision was made to proceed with PCI. Weight-based bivalirudin was given for anticoagulation. Once a therapeutic ACT was achieved, a 6 JamaicaFrench XB LAD 3.5 guide catheter was inserted. A Runthrough coronary guidewire was used to cross the lesion. The lesion was predilated with a 2.5 x 15 balloon. I attempted to advance the wire into the second diagonal to protect the vessel. However, there was difficulty with poor visualization. The lesion was then stented with a 2.75 x 33 mm Xience drug-eluting stent. The stent was postdilated with a 3.0 x 15 noncompliant balloon in the proximal segment. Following PCI, there was 0% residual stenosis and TIMI-3 flow. The second diagonal was jailed by the stent with worsened ostial stenosis but TIMI 3 flow.. The patient tolerated the procedure well. There were no immediate procedural complications. A TR band was used for radial hemostasis. The patient was transferred to the post catheterization recovery area for further monitoring.  PCI Data: I will  Vessel - LAD/Segment - mid  Percent Stenosis (pre) 95%  TIMI-flow 3  Stent :  2.75 x 33 mm Xience drug-eluting stent postdilated with a 3.0 noncompliant balloon  Percent Stenosis (post) 0%  TIMI-flow (post) 3  Final Conclusions:  1. Significant three-vessel coronary artery disease with 95% mid LAD stenosis, 60% ostial left circumflex stenosis and 80-90% proximal right PDA stenosis. The culprit the LAD.  2. Mildly reduced LV systolic function with an ejection fraction of 45% and mildly elevated left ventricular end-diastolic pressure.  3. Successful angioplasty and drug-eluting stent placement to the mid LAD. The second diagonal was jailed by the stent with worsened ostial stenosis but TIMI 3 flow. This was left to be treated medically.  Recommendations:  Dual antiplatelet therapy for at least one year. PCI of the right PDA can be staged. The patient had severe anxiety throughout the case. It was difficult to sedate him.  He was having chest pain at the beginning of the diagnostic catheterization and worsened before PCI. He continued to have chest pain even after the case. He was started on nitroglycerin drip.  Relook cath: 02/04/2014 PCI Note: The RCA was engaged with a ICARI 1.5 guide. ACT was 275 with heparin bolus. He had been loaded with Effient yesterday and received ASA/Effient this am. I passed a Cougar IC wire down the RCA into the PDA. A 2.0 x 15 mm balloon was used to pre-dilate the stenosis. A 2.5 x 23 mm Xience DES was deployed in the PDA. The stent was post-dilated with a 2.75 x 15 mm Arlee balloon x 1. The stenosis was taken from 95% down to 0%. The sheath was removed from the right radial artery and a Terumo hemostasis band was applied at the arteriotomy site on the right wrist. There were no immediate complications. The patient was taken to the recovery area in stable condition.  Hemodynamic Findings:  Central aortic pressure: 125/86  Angiographic Findings:  Left main: No obstructive disease.  Left Anterior Descending Artery: Large caliber vessel that courses to  the apex. The mid stented segment is patent without restenosis. The first diagonal is moderate in caliber and is patent without stenosis. The second diagonal branch is jailed by the LAD stent with ostial 70% narrowing but excellent flow.  Circumflex Artery: Large caliber vessel with ostial  60% stenosis. The first obtuse marginal branch is moderate in caliber with diffuse 30% stenosis. The second obtuse marginal branch is a moderate caliber bifurcating vessel with mild plaque.  Right Coronary Artery: Large dominant vessel with 20% proximal and mid stenosis. The PDA is moderate caliber vessel with 90-95% stenosis.  Left Ventricular Angiogram: Deferred.  Impression:  1. Double vessel CAD with patent LAD stent, severe PDA stenosis  2. Ongoing chest pain  3. Successful PTCA/DES x 1 PDA  Recommendations: Will continue ASA/Effient for at least one year. Continue statin, beta blocker. Likely d/c home in am.   EKG: 05-Feb-2014 04:35:19   Normal sinus rhythm Lateral T wave abnormality  Vent. rate 60 BPM PR interval 200 ms QRS duration 80 ms QT/QTc 388/388 ms P-R-T axes 33 11 101  FOLLOW UP PLANS AND APPOINTMENTS Allergies  Allergen Reactions  . Naproxen Other (See Comments)    bleeding     Medication List    STOP taking these medications       lisinopril 20 MG tablet  Commonly known as:  PRINIVIL,ZESTRIL      TAKE these medications       ALPRAZolam 0.5 MG tablet  Commonly known as:  XANAX  Take 1 tablet (0.5 mg total) by mouth 3 (three) times daily as needed for anxiety.     aspirin 81 MG EC tablet  Take 1 tablet (81 mg total) by mouth daily.     atorvastatin 80 MG tablet  Commonly known as:  LIPITOR  Take 1 tablet (80 mg total) by mouth daily at 6 PM.     metoprolol succinate 25 MG 24 hr tablet  Commonly known as:  TOPROL XL  Take 1 tablet (25 mg total) by mouth daily.     nitroGLYCERIN 0.4 MG SL tablet  Commonly known as:  NITROSTAT  Place 1 tablet (0.4 mg total) under  the tongue every 5 (five) minutes as needed for chest pain.     oxyCODONE-acetaminophen 7.5-325 MG per tablet  Commonly known as:  PERCOCET  Take 1 tablet by mouth every 4 (four) hours as needed (back pain).     prasugrel 10 MG Tabs tablet  Commonly known as:  EFFIENT  Take 1 tablet (10 mg total) by mouth daily.        Discharge Instructions   Amb Referral to Cardiac Rehabilitation    Complete by:  As directed      Diet - low sodium heart healthy    Complete by:  As directed      Increase activity slowly    Complete by:  As directed           Follow-up Information   Follow up with Lib[facility] Negron [facility]. (The office will call)    Contact information:   [address] [place] [address]       BRING ALL MEDICATIONS WITH YOU TO FOLLOW UP APPOINTMENTS  Time spent with patient to include physician time: 37 min Signed: Theodore Demark, PA-C 02/05/2014, 9:46 AM Co-Sign MD

## 2014-02-05 NOTE — Progress Notes (Signed)
     SUBJECTIVE: No chest pain or SOB.   BP 106/65  Pulse 60  Temp(Src) 97.5 F (36.4 C) (Oral)  Resp 18  Ht 5\' 11"  (1.803 m)  Wt 339 lb 11.7 oz (154.1 kg)  BMI 47.40 kg/m2  SpO2 99%  Intake/Output Summary (Last 24 hours) at 02/05/14 0724 Last data filed at 02/05/14 0431  Gross per 24 hour  Intake    240 ml  Output   2300 ml  Net  -2060 ml   PHYSICAL EXAM General: Well developed, well nourished, in no acute distress. Alert and oriented x 3.  Psych:  Good affect, responds appropriately Neck: No JVD. No masses noted.  Lungs: Clear bilaterally with no wheezes or rhonci noted.  Heart: RRR with no murmurs noted. Abdomen: Bowel sounds are present. Soft, non-tender.  Extremities: No lower extremity edema.   LABS: Basic Metabolic Panel:  Recent Labs  62/95/2806/30/15 0843 02/02/14 2142  02/04/14 0325 02/05/14 0336  NA 140  --   < > 139 141  K 4.0  --   < > 4.2 4.5  CL 104  --   < > 104 103  CO2  --   --   < > 23 25  GLUCOSE 101*  --   < > 90 88  BUN 14  --   < > 12 11  CREATININE 1.10  --   < > 1.05 1.06  CALCIUM  --   --   < > 8.8 8.9  MG  --  1.9  --   --   --   < > = values in this interval not displayed. CBC:  Recent Labs  02/02/14 0815  02/04/14 0325 02/05/14 0336  WBC 8.6  < > 8.6 7.9  NEUTROABS 6.1  --   --   --   HGB 13.7  < > 12.5* 12.5*  HCT 41.0  < > 38.4* 38.3*  MCV 89.1  < > 88.9 89.5  PLT 208  < > 192 179  < > = values in this interval not displayed. Cardiac Enzymes:  Recent Labs  02/03/14 0105 02/03/14 0825 02/04/14 0700  TROPONINI 0.36* <0.30 1.25*   Fasting Lipid Panel:  Recent Labs  02/03/14 0105  CHOL 214*  HDL 39*  LDLCALC 153*  TRIG 109  CHOLHDL 5.5   Current Meds: . aspirin EC  81 mg Oral Daily  . atorvastatin  80 mg Oral q1800  . metoprolol tartrate  12.5 mg Oral BID  . prasugrel  10 mg Oral Daily   ASSESSMENT AND PLAN:  1. CAD/NSTEMI: Pt admitted with chest pain by Dr. Patty SermonsBrackbill. Cardiac cath 02/03/14 per Dr. Kirke CorinArida with  severe stenosis mid LAD treated with DES x 1. Staged PCI of PDA yesterday with placement of DES x 1 in PDA. Post cath labs ok. Stable this am. He will need ASA and Effient for at least one year. Continue statin and beta blocker.    2. HTN:  BP well controlled. No changes.   3. HLD: Continue statin.   4. Severe anxiety: Will discharge home on Xanax 0.5 mg po TID. He should be given a 2 week supply and then f/u with primary care.   5. Dispo: Discharge home today. Follow up in [facility] in 2-3 weeks. He will need the Effient starter packet.   Brett Harper  7/3/20157:24 AM

## 2014-02-06 NOTE — Discharge Summary (Signed)
See full note.cdm 

## 2014-02-23 ENCOUNTER — Encounter: Payer: Self-pay | Admitting: Cardiology

## 2014-02-23 ENCOUNTER — Encounter: Payer: Self-pay | Admitting: *Deleted

## 2014-02-23 ENCOUNTER — Ambulatory Visit (INDEPENDENT_AMBULATORY_CARE_PROVIDER_SITE_OTHER): Payer: BC Managed Care – PPO | Admitting: Cardiology

## 2014-02-23 VITALS — BP 110/76 | HR 81 | Ht 71.0 in | Wt 328.0 lb

## 2014-02-23 DIAGNOSIS — E785 Hyperlipidemia, unspecified: Secondary | ICD-10-CM

## 2014-02-23 DIAGNOSIS — R400 Somnolence: Secondary | ICD-10-CM

## 2014-02-23 DIAGNOSIS — R404 Transient alteration of awareness: Secondary | ICD-10-CM

## 2014-02-23 DIAGNOSIS — I2510 Atherosclerotic heart disease of native coronary artery without angina pectoris: Secondary | ICD-10-CM

## 2014-02-23 MED ORDER — METOPROLOL SUCCINATE ER 25 MG PO TB24: 12.5000 mg | ORAL_TABLET | Freq: Every day | ORAL | Status: DC

## 2014-02-23 NOTE — Progress Notes (Signed)
Clinical Summary Brett Harper is a 43 y.o.male seen today as a hospital follow up appointment.  1. CAD - 02/2014 admitted to [facility] with NSTEMI - cath 02/03/14 with LM normal, LAD 95% mid with more distal 80%, D2 50%, LCX ostial 60%, RCA PDA 90% prox, LVEF 45% by LV gram. Received DES to LAD with jailing of D2 but still with TIMI 3 flow. Continued to have chest pain. The following day she had staged PCI to PDA with DES. Had ongoing chest pain after procedures.  - no echo on file   - since discharge has had some generalized fatigue, new since admission. Felt lightheaded and dizzy, fell in shower but did not lose consciousness. Dizziness only when on feet.  - compliant with meds, including DAPT with ASA and effient. - awaiting to start cardiac rehab  2. Hyperlipidemia - 02/2014 TC 214 TG 109 HDL 39 LDL 153 - started on statin during admission 02/2014  3. OSA? - +snore, +apneic episodes. + daytime somnolence   Past Medical History  Diagnosis Date  . Hypertension   . Pulmonary sarcoidosis     a. 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 H/O inguinal hernia repair   . Gout   . Obesity      Allergies  Allergen Reactions  . Naproxen Other (See Comments)    bleeding     Current Outpatient Prescriptions  Medication Sig Dispense Refill  . ALPRAZolam (XANAX) 0.5 MG tablet Take 1 tablet (0.5 mg total) by mouth 3 (three) times daily as needed for anxiety.  30 tablet  0  . aspirin EC 81 MG EC tablet Take 1 tablet (81 mg total) by mouth daily.      Marland Kitchen atorvastatin (LIPITOR) 80 MG tablet Take 1 tablet (80 mg total) by mouth daily at 6 PM.  30 tablet  11  . metoprolol succinate (TOPROL XL) 25 MG 24 hr tablet Take 1 tablet (25 mg total) by mouth daily.  30 tablet  11  . nitroGLYCERIN (NITROSTAT) 0.4 MG SL tablet Place 1 tablet (0.4 mg total) under the tongue every 5 (five) minutes as needed for chest pain.  25 tablet  3  .  oxyCODONE-acetaminophen (PERCOCET) 7.5-325 MG per tablet Take 1 tablet by mouth every 4 (four) hours as needed (back pain).      . prasugrel (EFFIENT) 10 MG TABS tablet Take 1 tablet (10 mg total) by mouth daily.  30 tablet  11   No current facility-administered medications for this visit.     No past surgical history on file.   Allergies  Allergen Reactions  . Naproxen Other (See Comments)    bleeding      Family History  Problem Relation Age of Onset  . Hypertension Mother   . Heart attack Father   . Diabetes Father   . Stroke Father   . Heart attack Maternal Grandfather   . Heart attack Paternal Grandfather   . Heart attack Cousin      stent placed in 23s     Social History Brett Harper reports that he has never smoked. He does not have any smokeless tobacco history on file. Brett Harper reports that he does not drink alcohol.   Review of Systems CONSTITUTIONAL: No weight loss, fever, chills, weakness or fatigue.  HEENT: Eyes: No visual loss, blurred vision, double vision or yellow sclerae.No hearing loss, sneezing, congestion, runny nose or sore throat.  SKIN: No  rash or itching.  CARDIOVASCULAR: per HPI RESPIRATORY: No shortness of breath, cough or sputum.  GASTROINTESTINAL: No anorexia, nausea, vomiting or diarrhea. No abdominal pain or blood.  GENITOURINARY: No burning on urination, no polyuria NEUROLOGICAL: No headache, dizziness, syncope, paralysis, ataxia, numbness or tingling in the extremities. No change in bowel or bladder control.  MUSCULOSKELETAL: No muscle, back pain, joint pain or stiffness.  LYMPHATICS: No enlarged nodes. No history of splenectomy.  PSYCHIATRIC: No history of depression or anxiety.  ENDOCRINOLOGIC: No reports of sweating, cold or heat intolerance. No polyuria or polydipsia.  Marland Kitchen   Physical Examination p 81 bp 110/76 Wt 328 lbs BMI 46 Gen: resting comfortably, no acute distress HEENT: no scleral icterus, pupils equal round and  reactive, no palptable cervical adenopathy,  CV: RRR, no m/r/g, no JVD, no carotid bruits Resp: Clear to auscultation bilaterally GI: abdomen is soft, non-tender, non-distended, normal bowel sounds, no hepatosplenomegaly MSK: extremities are warm, no edema.  Skin: warm, no rash Neuro:  no focal deficits Psych: appropriate affect   Diagnostic Studies Hemodynamics:  AO: 110/87 mmHg  LV: 117/13 mmHg  LVEDP: 18 mmHg  Coronary angiography:  Coronary dominance: Right  Left Main: Normal  Left Anterior Descending (LAD): Normal in size and mildly calcified. There is a 95% stenosis in the midsegment after the first septal perforator followed by a diffuse area of 80% disease.  1st diagonal (D1): Normal in size with minor irregularities.  2nd diagonal (D2): Normal in size with 50% ostial stenosis  3rd diagonal (D3): Small in size with minor irregularities.  Circumflex (LCx): Normal in size and nondominant. There is 60% ostial stenosis. The rest of the vessel has minor irregularities.  1st obtuse marginal: Normal in size with no significant disease.  2nd obtuse marginal: Small in size with no significant disease  3rd obtuse marginal: Normal in size with no significant disease  Right Coronary Artery: The normal in size and dominant. There is 20% proximal stenosis.  Posterior descending artery: Large in size with 80-90% proximal stenosis  Posterior AV segment: Normal in size with no significant disease.  Posterolateral branchs: No significant disease Left ventriculography: Left ventricular systolic function is mildly reduced , LVEF is estimated at 45 %, there is no significant mitral regurgitation . Mild anterior and apical hypokinesis.  PCI Note: Following the diagnostic procedure, the decision was made to proceed with PCI. Weight-based bivalirudin was given for anticoagulation. Once a therapeutic ACT was achieved, a 6 [place] XB LAD 3.5 guide catheter was inserted. A Runthrough coronary guidewire  was used to cross the lesion. The lesion was predilated with a 2.5 x 15 balloon. I attempted to advance the wire into the second diagonal to protect the vessel. However, there was difficulty with poor visualization. The lesion was then stented with a 2.75 x 33 mm Xience drug-eluting stent. The stent was postdilated with a 3.0 x 15 noncompliant balloon in the proximal segment. Following PCI, there was 0% residual stenosis and TIMI-3 flow. The second diagonal was jailed by the stent with worsened ostial stenosis but TIMI 3 flow.. The patient tolerated the procedure well. There were no immediate procedural complications. A TR band was used for radial hemostasis. The patient was transferred to the post catheterization recovery area for further monitoring.  PCI Data: I will  Vessel - LAD/Segment - mid  Percent Stenosis (pre) 95%  TIMI-flow 3  Stent : 2.75 x 33 mm Xience drug-eluting stent postdilated with a 3.0 noncompliant balloon  Percent Stenosis (post) 0%  TIMI-flow (post) 3  Final Conclusions:  1. Significant three-vessel coronary artery disease with 95% mid LAD stenosis, 60% ostial left circumflex stenosis and 80-90% proximal right PDA stenosis. The culprit the LAD.  2. Mildly reduced LV systolic function with an ejection fraction of 45% and mildly elevated left ventricular end-diastolic pressure.  3. Successful angioplasty and drug-eluting stent placement to the mid LAD. The second diagonal was jailed by the stent with worsened ostial stenosis but TIMI 3 flow. This was left to be treated medically.  Recommendations:  Dual antiplatelet therapy for at least one year. PCI of the right PDA can be staged. The patient had severe anxiety throughout the case. It was difficult to sedate him.  He was having chest pain at the beginning of the diagnostic catheterization and worsened before PCI. He continued to have chest pain even after the case. He was started on nitroglycerin drip.   Hemodynamic Findings:    Central aortic pressure: 125/86  Angiographic Findings:  Left main: No obstructive disease.  Left Anterior Descending Artery: Large caliber vessel that courses to the apex. The mid stented segment is patent without restenosis. The first diagonal is moderate in caliber and is patent without stenosis. The second diagonal branch is jailed by the LAD stent with ostial 70% narrowing but excellent flow.  Circumflex Artery: Large caliber vessel with ostial 60% stenosis. The first obtuse marginal branch is moderate in caliber with diffuse 30% stenosis. The second obtuse marginal branch is a moderate caliber bifurcating vessel with mild plaque.  Right Coronary Artery: Large dominant vessel with 20% proximal and mid stenosis. The PDA is moderate caliber vessel with 90-95% stenosis.  Left Ventricular Angiogram: Deferred.  Impression:  1. Double vessel CAD with patent LAD stent, severe PDA stenosis  2. Ongoing chest pain  3. Successful PTCA/DES x 1 PDA  Recommendations: Will continue ASA/Effient for at least one year. Continue statin, beta blocker. Likely d/c home in am.  Complications: None. The patient tolerated the procedure well.      Assessment and Plan  1. CAD - recent NSTEMI, s/p stenting to LAD and PDA - no significant current symptoms - obtain echo s/p MI to establish LV function, mildly decreased by LV gram during admission - continue current medical therapy, decrease Toprol XL to12.5mg  daily in setting of orthostatic symptoms and fatigue - he is not on ACE-I due to low blood pressures  2. Hyperlipidemia - just started on statin earlier this month, repeat panel at next visit  3. OSA? - several risk factors for OSA, will refer to sleep medicine for evaluation.      Antoine PocheJonathan F. Branch, M.D., F.A.C.C.

## 2014-02-23 NOTE — Patient Instructions (Signed)
Your physician has requested that you have an echocardiogram. Echocardiography is a painless test that uses sound waves to create images of your heart. It provides your doctor with information about the size and shape of your heart and how well your heart's chambers and valves are working. This procedure takes approximately one hour. There are no restrictions for this procedure. Office will contact with results via phone or letter.   Referral to Dr. Andrey CampanileWilson for somnolence Decrease Toprol XL to 12.5mg  daily  Continue all other medications.   Out of work note provided today Your physician wants you to follow up in:  3 months.  You will receive a reminder letter in the mail one-two months in advance.  If you don't receive a letter, please call our office to schedule the follow up appointment

## 2014-03-29 ENCOUNTER — Telehealth: Payer: Self-pay | Admitting: Cardiology

## 2014-03-29 NOTE — Telephone Encounter (Signed)
Patient continues to have a rash and itching. Wants to know if one of his medication's could be causing the rash.

## 2014-03-29 NOTE — Telephone Encounter (Signed)
Left message to return call 

## 2014-04-01 NOTE — Telephone Encounter (Signed)
Spoke with wife Misty Stanley) - c/o rash, bumps / itching from head to toe.  Stated this rash/itching was going on at time of OV on 02/23/2014 but he did not mention to MD.  Stated he has had this since about 2 weeks after hosp discharge.  Wife reports no changes in detergent, colognes, etc.. at home.  Also, no c/o chest pain, dizziness, or SOB.  States that Benadryl cream & nothing makes it any better.  She questions if Effient could be the cause.

## 2014-04-02 MED ORDER — ATORVASTATIN CALCIUM 80 MG PO TABS: 80.0000 mg | ORAL_TABLET | Freq: Every day | ORAL | Status: DC

## 2014-04-02 NOTE — Telephone Encounter (Signed)
Wife Misty Stanley) notified.

## 2014-04-02 NOTE — Addendum Note (Signed)
Addended by: Lesle Chris on: 04/02/2014 12:04 PM   Modules accepted: Orders

## 2014-04-02 NOTE — Telephone Encounter (Signed)
From the notes I see it appears the atorvastatin, Toprol, and effient were all new medications at discharge. Any of them could be causing a possible drug rash. I would start first with holding the atorvastatin for a few days and following symptoms. Have him call us back mid to late next week with update, if not improved then can consider changing effient  Dominga Ferry MD

## 2014-04-07 NOTE — Telephone Encounter (Addendum)
Patient's rash is no better per his wife. He stopped taking prasugrel (EFFIENT) 10 MG TABS tablet on Aug 24th and went to his PCP this past Monday.  PCP told him that the rash was due to medication and to contact us back to let the doctor know Please call patient

## 2014-04-08 NOTE — Telephone Encounter (Signed)
Left message to return call 

## 2014-04-08 NOTE — Telephone Encounter (Signed)
Wife Misty Stanley) notified.   OV scheduled with Joni Reining for tomorrow in [place].

## 2014-04-08 NOTE — Telephone Encounter (Signed)
Wife stated that he stopped all medications.  Been off of all 3 (Metoprolol, Effient, & Lipitor) since 03/29/2014 & rash is no better.  Wife stated that she did not know he had stopped meds until discussing with him to stop the Lipitor on 04/02/2014.  Did see PMD this Monday & was told that it was definitely reaction to medication.  No change in rash as of today.

## 2014-04-08 NOTE — Telephone Encounter (Signed)
He needs to restart his effient immediately. His stent is at risk while off that medication, from our last message we had mentioned stopping atorva and follow the rash. If not resolved then we would CHANGE his effient. He needs to be see in clinic to sort this out, can he come see Samara Deist in [place] tomorrow.   Dominga Ferry MD

## 2014-04-08 NOTE — Progress Notes (Signed)
HPI: Mr. Milley is a 43 year old patient of Dr. Dina Rich in the [place] office, that we follow for ongoing assessment and management of CAD, most recent admission in July 2015 with cardiac catheterization, drug-eluting stent to the LAD with jailing of the diagonal 2 was still with TIMI 3 flow in the setting of a 95% mid and distal 80% lesion, also PCI to PDA with drug-eluting stent.. Other history includes obstructive sleep apnea, hyperlipidemia, hypertension.  He was last seen by Dr. [name] in July of 2015, and was found to be asymptomatic. He was to have a repeat echocardiogram to establish LV function, his metoprolol was decreased to 12.5 mg daily in the setting of orthostatic blood pressure. He was now placed on an ACE secondary to hypotension. He was to have a followup fasting lipid profile after this next visit. He was referred for sleep medicine for evaluation of obstructive sleep apnea.  The patient called our office on 04/02/2014 with complaints of a rash. He stopped taking on August 24 and went to his PCP and told them that the rash was due to the medication and he was to contact us to let us know. He was also discontinuing on the soma, Toprol, and Lipitor. He was advised to restart Effient in the setting of recent PCI. He is here to discuss his current symptoms.   He continues to have rash but has started the Effient and other medications back. Rash worsened with increased itching. He has had no new soaps and detergents      Allergies  Allergen Reactions  . Naproxen Other (See Comments)    bleeding    Current Outpatient Prescriptions  Medication Sig Dispense Refill  . aspirin EC 81 MG EC tablet Take 1 tablet (81 mg total) by mouth daily.      Marland Kitchen atorvastatin (LIPITOR) 80 MG tablet Take 1 tablet (80 mg total) by mouth daily at 6 PM. (04/02/2014 - HOLDING AS OF TODAY DUE TO RASH)      . clonazePAM (KLONOPIN) 0.5 MG tablet       . colchicine 0.6 MG tablet       . metoprolol  succinate (TOPROL XL) 25 MG 24 hr tablet Take 0.5 tablets (12.5 mg total) by mouth daily.      . nitroGLYCERIN (NITROSTAT) 0.4 MG SL tablet Place 1 tablet (0.4 mg total) under the tongue every 5 (five) minutes as needed for chest pain.  25 tablet  3  . prasugrel (EFFIENT) 10 MG TABS tablet Take 1 tablet (10 mg total) by mouth daily.  30 tablet  11  . traMADol (ULTRAM) 50 MG tablet        No current facility-administered medications for this visit.    Past Medical History  Diagnosis Date  . Hypertension   . Pulmonary sarcoidosis     a. 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 H/O inguinal hernia repair   . Gout   . Obesity     History reviewed. No pertinent past surgical history.  ROS: Review of systems complete and found to be negative unless listed above  PHYSICAL EXAM BP 132/88  Pulse 96  Ht  (1.803 m)  Wt 333 lb (151.048 kg)  BMI 46.46 kg/m2 General: Well developed, well nourished, in no acute distress, morbidly obese. Head: Eyes PERRLA, No xanthomas.   Normal cephalic and atramatic  Lungs: Clear bilaterally to auscultation and percussion. Heart: HRRR S1 S2, without MRG.  Pulses are 2+ & equal.            No carotid bruit. No JVD.  No abdominal bruits. No femoral bruits. Abdomen: Bowel sounds are positive, abdomen soft and non-tender without masses or                  Hernia's noted. Msk:  Back normal, normal gait. Normal strength and tone for age.Diffuse red rash on arms, truck and legs.  Extremities: No clubbing, cyanosis or edema.  DP +1 Neuro: Alert and oriented X 3. Psych:  Good affect, responds appropriately    ASSESSMENT AND PLAN

## 2014-04-09 ENCOUNTER — Encounter: Payer: Self-pay | Admitting: Adult Health

## 2014-04-09 ENCOUNTER — Ambulatory Visit (INDEPENDENT_AMBULATORY_CARE_PROVIDER_SITE_OTHER): Payer: BC Managed Care – PPO | Admitting: Adult Health

## 2014-04-09 VITALS — BP 132/88 | HR 96 | Ht 71.0 in | Wt 333.0 lb

## 2014-04-09 DIAGNOSIS — R21 Rash and other nonspecific skin eruption: Secondary | ICD-10-CM

## 2014-04-09 DIAGNOSIS — E785 Hyperlipidemia, unspecified: Secondary | ICD-10-CM

## 2014-04-09 DIAGNOSIS — F419 Anxiety disorder, unspecified: Secondary | ICD-10-CM

## 2014-04-09 DIAGNOSIS — F411 Generalized anxiety disorder: Secondary | ICD-10-CM

## 2014-04-09 MED ORDER — CLOPIDOGREL BISULFATE 75 MG PO TABS: 75.0000 mg | ORAL_TABLET | Freq: Every day | ORAL | Status: DC

## 2014-04-09 NOTE — Progress Notes (Deleted)
Name: Brett Harper    DOB: [birth year]  Age: 43 y.o.  MR#: [id number]       PCP:  Estanislado Pandy, MD      Insurance: Payor: BLUE CROSS BLUE SHIELD / Plan: BCBS PPO OUT OF STATE / Product Type: *No Product type* /   CC:    Chief Complaint  Patient presents with  . Coronary Artery Disease  . Hypertension    VS Filed Vitals:   04/09/14 1520  BP: 132/88  Pulse: 96  Height:  (1.803 m)  Weight: 333 lb (151.048 kg)    Weights Current Weight  04/09/14 333 lb (151.048 kg)  02/23/14 328 lb (148.78 kg)  02/05/14 339 lb 11.7 oz (154.1 kg)    Blood Pressure  BP Readings from Last 3 Encounters:  04/09/14 132/88  02/23/14 110/76  02/05/14 112/68     Admit date:  (Not on file) Last encounter with RMR:  Visit date not found   Allergy Naproxen  Current Outpatient Prescriptions  Medication Sig Dispense Refill  . aspirin EC 81 MG EC tablet Take 1 tablet (81 mg total) by mouth daily.      Marland Kitchen atorvastatin (LIPITOR) 80 MG tablet Take 1 tablet (80 mg total) by mouth daily at 6 PM. (04/02/2014 - HOLDING AS OF TODAY DUE TO RASH)      . clonazePAM (KLONOPIN) 0.5 MG tablet       . colchicine 0.6 MG tablet       . metoprolol succinate (TOPROL XL) 25 MG 24 hr tablet Take 0.5 tablets (12.5 mg total) by mouth daily.      . nitroGLYCERIN (NITROSTAT) 0.4 MG SL tablet Place 1 tablet (0.4 mg total) under the tongue every 5 (five) minutes as needed for chest pain.  25 tablet  3  . prasugrel (EFFIENT) 10 MG TABS tablet Take 1 tablet (10 mg total) by mouth daily.  30 tablet  11  . traMADol (ULTRAM) 50 MG tablet        No current facility-administered medications for this visit.    Discontinued Meds:    Medications Discontinued During This Encounter  Medication Reason  . ALPRAZolam (XANAX) 0.5 MG tablet Error    Patient Active Problem List   Diagnosis Date Noted  . Coronary atherosclerosis of native coronary artery 02/05/2014  . Dyslipidemia 02/04/2014  . Family history of coronary artery  disease in father 02/04/2014  . Anxiety 02/04/2014  . NSTEMI (non-ST elevated myocardial infarction) 02/03/2014  . Chest pain 02/02/2014  . Hypertension   . Pulmonary sarcoidosis   . H/O inguinal hernia repair   . Gout   . Obesity- BMI 47     LABS    Component Value Date/Time   NA 141 02/05/2014 0336   NA 139 02/04/2014 0325   NA 139 02/03/2014 0105   K 4.5 02/05/2014 0336   K 4.2 02/04/2014 0325   K 4.2 02/03/2014 0105   CL 103 02/05/2014 0336   CL 104 02/04/2014 0325   CL 99 02/03/2014 0105   CO2 25 02/05/2014 0336   CO2 23 02/04/2014 0325   CO2 27 02/03/2014 0105   GLUCOSE 88 02/05/2014 0336   GLUCOSE 90 02/04/2014 0325   GLUCOSE 99 02/03/2014 0105   BUN 11 02/05/2014 0336   BUN 12 02/04/2014 0325   BUN 14 02/03/2014 0105   CREATININE 1.06 02/05/2014 0336   CREATININE 1.05 02/04/2014 0325   CREATININE 1.18 02/03/2014 0105   CALCIUM 8.9 02/05/2014 0336  CALCIUM 8.8 02/04/2014 0325   CALCIUM 8.9 02/03/2014 0105   GFRNONAA 83* 02/05/2014 0336   GFRNONAA 84* 02/04/2014 0325   GFRNONAA 73* 02/03/2014 0105   GFRAA >90 02/05/2014 0336   GFRAA >90 02/04/2014 0325   GFRAA 85* 02/03/2014 0105   CMP     Component Value Date/Time   NA 141 02/05/2014 0336   K 4.5 02/05/2014 0336   CL 103 02/05/2014 0336   CO2 25 02/05/2014 0336   GLUCOSE 88 02/05/2014 0336   BUN 11 02/05/2014 0336   CREATININE 1.06 02/05/2014 0336   CALCIUM 8.9 02/05/2014 0336   PROT 6.7 02/03/2014 0105   ALBUMIN 3.3* 02/03/2014 0105   AST 31 02/03/2014 0105   ALT 28 02/03/2014 0105   ALKPHOS 103 02/03/2014 0105   BILITOT 0.3 02/03/2014 0105   GFRNONAA 83* 02/05/2014 0336   GFRAA >90 02/05/2014 0336       Component Value Date/Time   WBC 7.9 02/05/2014 0336   WBC 8.6 02/04/2014 0325   WBC 8.5 02/03/2014 0105   HGB 12.5* 02/05/2014 0336   HGB 12.5* 02/04/2014 0325   HGB 13.0 02/03/2014 0105   HCT 38.3* 02/05/2014 0336   HCT 38.4* 02/04/2014 0325   HCT 39.1 02/03/2014 0105   MCV 89.5 02/05/2014 0336   MCV 88.9 02/04/2014 0325   MCV 89.1 02/03/2014 0105    Lipid Panel     Component Value  Date/Time   CHOL 214* 02/03/2014 0105   TRIG 109 02/03/2014 0105   HDL 39* 02/03/2014 0105   CHOLHDL 5.5 02/03/2014 0105   VLDL 22 02/03/2014 0105   LDLCALC 153* 02/03/2014 0105    ABG    Component Value Date/Time   TCO2 26 02/02/2014 0843     Lab Results  Component Value Date   TSH 1.330 02/02/2014   BNP (last 3 results) No results found for this basename: PROBNP,  in the last 8760 hours Cardiac Panel (last 3 results) No results found for this basename: CKTOTAL, CKMB, TROPONINI, RELINDX,  in the last 72 hours  Iron/TIBC/Ferritin/ %Sat No results found for this basename: iron, tibc, ferritin, ironpctsat     EKG Orders placed during the hospital encounter of 02/02/14  . EKG 12-LEAD  . EKG 12-LEAD  . EKG 12-LEAD  . EKG 12-LEAD  . EKG 12-LEAD  . EKG 12-LEAD  . EKG 12-LEAD  . EKG 12-LEAD  . EKG 12-LEAD  . EKG 12-LEAD  . EKG  . EKG 12-LEAD  . EKG 12-LEAD  . EKG 12-LEAD  . EKG 12-LEAD  . EKG 12-LEAD  . EKG 12-LEAD  . EKG 12-LEAD  . EKG 12-LEAD  . EKG 12-LEAD  . EKG     Prior Assessment and Plan Problem List as of 04/09/2014     Cardiovascular and Mediastinum   Hypertension   NSTEMI (non-ST elevated myocardial infarction)   Coronary atherosclerosis of native coronary artery     Respiratory   Pulmonary sarcoidosis     Other   Obesity- BMI 47   Family history of coronary artery disease in father   H/O inguinal hernia repair   Gout   Chest pain   Dyslipidemia   Anxiety       Imaging: No results found.

## 2014-04-09 NOTE — Patient Instructions (Addendum)
Your physician recommends that you schedule a follow-up appointment in: 1-2 weeks    STOP Effient  START Plavix 75 mg daily   START Prednisone taper dose.I called it into your pharmacy  40 mg daily for 3 days,then 20 mg daily for 3 days,then 10 mg daily for 3 days,then 5 mg daily for 3 days and the STOP    Use Benadryl 25-50 mg every 6 hrs as needed for itching

## 2014-04-09 NOTE — Assessment & Plan Note (Signed)
He has recently been started on klonopin and tramadol on Monday of this week.

## 2014-04-09 NOTE — Assessment & Plan Note (Signed)
He is to continue on DAPT with change to Plavix from Effient.  He has used NTG every other day lately but has been doing some heavy lifting at work. If continues to use a lot of NTG will consider adding long acting nitrates vs repeat stress test, as he stopped the Effient for about a week.

## 2014-04-09 NOTE — Assessment & Plan Note (Signed)
He has been on atorvastatin and is tolerating it. Do not think that rash is related.

## 2014-04-09 NOTE — Assessment & Plan Note (Signed)
BP is well controlled. No changes in his regimen for now.

## 2014-04-09 NOTE — Assessment & Plan Note (Signed)
He did not find any improvement when stopping the atorvastatin for two weeks. This leaves Effient and Toprol. I will stop the Effient and change to Plavix 74 mg daily.  Another consideration is ASA allergy. He has not taken ASA regularly so he is uncertain that he has had reaction in the past.    I will begin steroid dose pack for the next 10 days in graduated lowering doses. Benadryl 25-50 mg Q 6 hours prn itching.   Will see him back in one to two weeks to follow up and evaluate his response to steriods and acceptance of Plavix.

## 2014-04-14 ENCOUNTER — Other Ambulatory Visit: Payer: Self-pay

## 2014-04-14 ENCOUNTER — Other Ambulatory Visit (INDEPENDENT_AMBULATORY_CARE_PROVIDER_SITE_OTHER): Payer: BC Managed Care – PPO

## 2014-04-14 DIAGNOSIS — R079 Chest pain, unspecified: Secondary | ICD-10-CM

## 2014-04-14 DIAGNOSIS — I2510 Atherosclerotic heart disease of native coronary artery without angina pectoris: Secondary | ICD-10-CM

## 2014-04-16 ENCOUNTER — Ambulatory Visit (INDEPENDENT_AMBULATORY_CARE_PROVIDER_SITE_OTHER): Payer: BC Managed Care – PPO | Admitting: Adult Health

## 2014-04-16 ENCOUNTER — Telehealth: Payer: Self-pay | Admitting: *Deleted

## 2014-04-16 ENCOUNTER — Encounter: Payer: Self-pay | Admitting: Adult Health

## 2014-04-16 VITALS — BP 118/90 | HR 97 | Ht 71.0 in | Wt 329.0 lb

## 2014-04-16 DIAGNOSIS — I10 Essential (primary) hypertension: Secondary | ICD-10-CM

## 2014-04-16 DIAGNOSIS — I2510 Atherosclerotic heart disease of native coronary artery without angina pectoris: Secondary | ICD-10-CM

## 2014-04-16 DIAGNOSIS — R21 Rash and other nonspecific skin eruption: Secondary | ICD-10-CM

## 2014-04-16 NOTE — Telephone Encounter (Signed)
Message copied by Jerrye Beavers on Fri Apr 16, 2014  4:21 PM ------      Message from: [place] F      Created: Thu Apr 15, 2014  2:18 PM       Echo looks good, there was no significant permanent damage done to his heart after his heart attack            Dominga Ferry MD ------

## 2014-04-16 NOTE — Telephone Encounter (Signed)
Pt informed of results.

## 2014-04-16 NOTE — Progress Notes (Deleted)
Name: Brett Harper    DOB: 1971-02-11  Age: 43 y.o.  MR#: [id number]       PCP:  Estanislado Pandy, MD      Insurance: Payor: BLUE CROSS BLUE SHIELD / Plan: BCBS PPO OUT OF STATE / Product Type: *No Product type* /   CC:    Chief Complaint  Patient presents with  . Coronary Artery Disease  . Hyperlipidemia    VS Filed Vitals:   04/16/14 1511  BP: 118/90  Pulse: 97  Height:  (1.803 m)  Weight: 329 lb (149.233 kg)  SpO2: 98%    Weights Current Weight  04/16/14 329 lb (149.233 kg)  04/09/14 333 lb (151.048 kg)  02/23/14 328 lb (148.78 kg)    Blood Pressure  BP Readings from Last 3 Encounters:  04/16/14 118/90  04/09/14 132/88  02/23/14 110/76     Admit date:  (Not on file) Last encounter with RMR:  04/09/2014   Allergy Naproxen  Current Outpatient Prescriptions  Medication Sig Dispense Refill  . aspirin EC 81 MG EC tablet Take 1 tablet (81 mg total) by mouth daily.      Marland Kitchen atorvastatin (LIPITOR) 80 MG tablet Take 1 tablet (80 mg total) by mouth daily at 6 PM. (04/02/2014 - HOLDING AS OF TODAY DUE TO RASH)      . clonazePAM (KLONOPIN) 0.5 MG tablet 0.5 mg 2 (two) times daily as needed.       . clopidogrel (PLAVIX) 75 MG tablet Take 1 tablet (75 mg total) by mouth daily.  90 tablet  3  . colchicine 0.6 MG tablet Take 0.6 mg by mouth as needed.       . metoprolol succinate (TOPROL XL) 25 MG 24 hr tablet Take 0.5 tablets (12.5 mg total) by mouth daily.      . nitroGLYCERIN (NITROSTAT) 0.4 MG SL tablet Place 1 tablet (0.4 mg total) under the tongue every 5 (five) minutes as needed for chest pain.  25 tablet  3  . predniSONE (DELTASONE) 10 MG tablet Take 10 mg by mouth daily with breakfast.       . traMADol (ULTRAM) 50 MG tablet Take 100 mg by mouth every 6 (six) hours as needed.        No current facility-administered medications for this visit.    Discontinued Meds:    Medications Discontinued During This Encounter  Medication Reason  . meloxicam (MOBIC) 15 MG tablet  Error    Patient Active Problem List   Diagnosis Date Noted  . Rash 04/09/2014  . Coronary atherosclerosis of native coronary artery 02/05/2014  . Dyslipidemia 02/04/2014  . Family history of coronary artery disease in father 02/04/2014  . Anxiety 02/04/2014  . NSTEMI (non-ST elevated myocardial infarction) 02/03/2014  . Chest pain 02/02/2014  . Hypertension   . Pulmonary sarcoidosis   . H/O inguinal hernia repair   . Gout   . Obesity- BMI 47     LABS    Component Value Date/Time   NA 141 02/05/2014 0336   NA 139 02/04/2014 0325   NA 139 02/03/2014 0105   K 4.5 02/05/2014 0336   K 4.2 02/04/2014 0325   K 4.2 02/03/2014 0105   CL 103 02/05/2014 0336   CL 104 02/04/2014 0325   CL 99 02/03/2014 0105   CO2 25 02/05/2014 0336   CO2 23 02/04/2014 0325   CO2 27 02/03/2014 0105   GLUCOSE 88 02/05/2014 0336   GLUCOSE 90 02/04/2014 0325  GLUCOSE 99 02/03/2014 0105   BUN 11 02/05/2014 0336   BUN 12 02/04/2014 0325   BUN 14 02/03/2014 0105   CREATININE 1.06 02/05/2014 0336   CREATININE 1.05 02/04/2014 0325   CREATININE 1.18 02/03/2014 0105   CALCIUM 8.9 02/05/2014 0336   CALCIUM 8.8 02/04/2014 0325   CALCIUM 8.9 02/03/2014 0105   GFRNONAA 83* 02/05/2014 0336   GFRNONAA 84* 02/04/2014 0325   GFRNONAA 73* 02/03/2014 0105   GFRAA >90 02/05/2014 0336   GFRAA >90 02/04/2014 0325   GFRAA 85* 02/03/2014 0105   CMP     Component Value Date/Time   NA 141 02/05/2014 0336   K 4.5 02/05/2014 0336   CL 103 02/05/2014 0336   CO2 25 02/05/2014 0336   GLUCOSE 88 02/05/2014 0336   BUN 11 02/05/2014 0336   CREATININE 1.06 02/05/2014 0336   CALCIUM 8.9 02/05/2014 0336   PROT 6.7 02/03/2014 0105   ALBUMIN 3.3* 02/03/2014 0105   AST 31 02/03/2014 0105   ALT 28 02/03/2014 0105   ALKPHOS 103 02/03/2014 0105   BILITOT 0.3 02/03/2014 0105   GFRNONAA 83* 02/05/2014 0336   GFRAA >90 02/05/2014 0336       Component Value Date/Time   WBC 7.9 02/05/2014 0336   WBC 8.6 02/04/2014 0325   WBC 8.5 02/03/2014 0105   HGB 12.5* 02/05/2014 0336   HGB 12.5* 02/04/2014 0325   HGB 13.0  02/03/2014 0105   HCT 38.3* 02/05/2014 0336   HCT 38.4* 02/04/2014 0325   HCT 39.1 02/03/2014 0105   MCV 89.5 02/05/2014 0336   MCV 88.9 02/04/2014 0325   MCV 89.1 02/03/2014 0105    Lipid Panel     Component Value Date/Time   CHOL 214* 02/03/2014 0105   TRIG 109 02/03/2014 0105   HDL 39* 02/03/2014 0105   CHOLHDL 5.5 02/03/2014 0105   VLDL 22 02/03/2014 0105   LDLCALC 153* 02/03/2014 0105    ABG    Component Value Date/Time   TCO2 26 02/02/2014 0843     Lab Results  Component Value Date   TSH 1.330 02/02/2014   BNP (last 3 results) No results found for this basename: PROBNP,  in the last 8760 hours Cardiac Panel (last 3 results) No results found for this basename: CKTOTAL, CKMB, TROPONINI, RELINDX,  in the last 72 hours  Iron/TIBC/Ferritin/ %Sat No results found for this basename: iron, tibc, ferritin, ironpctsat     EKG Orders placed during the hospital encounter of 02/02/14  . EKG 12-LEAD  . EKG 12-LEAD  . EKG 12-LEAD  . EKG 12-LEAD  . EKG 12-LEAD  . EKG 12-LEAD  . EKG 12-LEAD  . EKG 12-LEAD  . EKG 12-LEAD  . EKG 12-LEAD  . EKG  . EKG 12-LEAD  . EKG 12-LEAD  . EKG 12-LEAD  . EKG 12-LEAD  . EKG 12-LEAD  . EKG 12-LEAD  . EKG 12-LEAD  . EKG 12-LEAD  . EKG 12-LEAD  . EKG     Prior Assessment and Plan Problem List as of 04/16/2014     Cardiovascular and Mediastinum   Hypertension   Last Assessment & Plan   04/09/2014 Office Visit Written 04/09/2014  4:02 PM by Jodelle Gross, NP     BP is well controlled. No changes in his regimen for now.    NSTEMI (non-ST elevated myocardial infarction)   Coronary atherosclerosis of native coronary artery   Last Assessment & Plan   04/09/2014 Office Visit Written 04/09/2014  4:04 PM  by Jodelle Gross, NP     He is to continue on DAPT with change to Plavix from Effient.  He has used NTG every other day lately but has been doing some heavy lifting at work. If continues to use a lot of NTG will consider adding long acting nitrates vs repeat  stress test, as he stopped the Effient for about a week.       Respiratory   Pulmonary sarcoidosis     Musculoskeletal and Integument   Rash   Last Assessment & Plan   04/09/2014 Office Visit Written 04/09/2014  4:00 PM by Jodelle Gross, NP     He did not find any improvement when stopping the atorvastatin for two weeks. This leaves Effient and Toprol. I will stop the Effient and change to Plavix 74 mg daily.  Another consideration is ASA allergy. He has not taken ASA regularly so he is uncertain that he has had reaction in the past.    I will begin steroid dose pack for the next 10 days in graduated lowering doses. Benadryl 25-50 mg Q 6 hours prn itching.   Will see him back in one to two weeks to follow up and evaluate his response to steriods and acceptance of Plavix.       Other   Obesity- BMI 47   Family history of coronary artery disease in father   H/O inguinal hernia repair   Gout   Chest pain   Dyslipidemia   Last Assessment & Plan   04/09/2014 Office Visit Written 04/09/2014  4:02 PM by Jodelle Gross, NP     He has been on atorvastatin and is tolerating it. Do not think that rash is related.     Anxiety   Last Assessment & Plan   04/09/2014 Office Visit Written 04/09/2014  4:01 PM by Jodelle Gross, NP     He has recently been started on klonopin and tramadol on Monday of this week.         Imaging: No results found.

## 2014-04-16 NOTE — Progress Notes (Signed)
HPI: Brett Harper is a 43 year old male patient of Dr. branch that we follow for ongoing assessment and management of coronary artery disease, most recent cardiac catheterization, July 2015, requiring a drug-eluting stent to the LAD with jailing of the diagonal tube is still with TIMI-3 flow. Also, followed by staged PCI to the PDA with drug-eluting stent. He was continued on dual antiplatelet therapy, with aspirin, and Effient.   He was last seen in the office one week ago after having complaints of rash associated with the rail total and atorvastatin. On the last office visit, he was changed from Effient to Pllavix. He was started on a Medrol Dosepak and Benadryl. He is here to discuss his response.  He continues to have a rash on his trunk and arms. It is some better with the use of the steroids, but not completely eliminated. Only other new, medications. He has been taking his tramadol, which is increased in dose to 100 mg every 6 hours by his primary care physician. He states he has been on tramadol in the past and did not have a rash. He insists that the rash began while he was in the hospital undergoing stent placement.   Allergies  Allergen Reactions  . Naproxen Other (See Comments)    bleeding    Current Outpatient Prescriptions  Medication Sig Dispense Refill  . aspirin EC 81 MG EC tablet Take 1 tablet (81 mg total) by mouth daily.      Marland Kitchen atorvastatin (LIPITOR) 80 MG tablet Take 1 tablet (80 mg total) by mouth daily at 6 PM. (04/02/2014 - HOLDING AS OF TODAY DUE TO RASH)      . clonazePAM (KLONOPIN) 0.5 MG tablet 0.5 mg 2 (two) times daily as needed.       . clopidogrel (PLAVIX) 75 MG tablet Take 1 tablet (75 mg total) by mouth daily.  90 tablet  3  . colchicine 0.6 MG tablet Take 0.6 mg by mouth as needed.       . metoprolol succinate (TOPROL XL) 25 MG 24 hr tablet Take 0.5 tablets (12.5 mg total) by mouth daily.      . nitroGLYCERIN (NITROSTAT) 0.4 MG SL tablet Place 1 tablet  (0.4 mg total) under the tongue every 5 (five) minutes as needed for chest pain.  25 tablet  3  . predniSONE (DELTASONE) 10 MG tablet Take 10 mg by mouth daily with breakfast.       . traMADol (ULTRAM) 50 MG tablet Take 100 mg by mouth every 6 (six) hours as needed.        No current facility-administered medications for this visit.    Past Medical History  Diagnosis Date  . Hypertension   . Pulmonary sarcoidosis     a. 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 H/O inguinal hernia repair   . Gout   . Obesity     History reviewed. No pertinent past surgical history.  ROS: Review of systems complete and found to be negative unless listed above  PHYSICAL EXAM BP 118/90  Pulse 97  Ht  (1.803 m)  Wt 329 lb (149.233 kg)  BMI 45.91 kg/m2  SpO2 98%  General: Well developed, well nourished, in no acute distress Head: Eyes PERRLA, No xanthomas.   Normal cephalic and atramatic  Lungs: Clear bilaterally to auscultation and percussion. Heart: HRRR S1 S2, without MRG.  Pulses are 2+ & equal.  No carotid bruit. No JVD.  No abdominal bruits. No femoral bruits. Abdomen: Bowel sounds are positive, abdomen soft and non-tender without masses or                  Hernia's noted. Msk:  Back normal, normal gait. Normal strength and tone for age. Red rash noted on trunk and arms. Extremities: No clubbing, cyanosis or edema.  DP +1 Neuro: Alert and oriented X 3. Psych:  Good affect, responds appropriately   ASSESSMENT AND PLAN

## 2014-04-16 NOTE — Assessment & Plan Note (Signed)
Good control of blood pressure currently. Will not make any other changes.

## 2014-04-16 NOTE — Patient Instructions (Signed)
Your physician wants you to follow-up in: 6 months You will receive a reminder letter in the mail two months in advance. If you don't receive a letter, please call our office to schedule the follow-up appointment.      Your physician recommends that you continue on your current medications as directed. Please refer to the Current Medication list given to you today.     Please follow up with your primary care doctor       Thank you for choosing [facility] !

## 2014-04-16 NOTE — Assessment & Plan Note (Signed)
He has had no recurrence of chest pain or dyspnea. He is medically compliant. Changes in his medication as described below. Switch out  Plavix and aspirin, and restart Effient only.

## 2014-04-16 NOTE — Telephone Encounter (Signed)
Left message to return call 

## 2014-04-16 NOTE — Assessment & Plan Note (Signed)
The patient has had no change in his rash with changes in medications to include stopping atorvastatin for a week, changing Effient to Plavix. He had been taking metoprolol in the past. He did not have a reaction to the Toprol at that time.  One final change will be tried, I will take him off his aspirin to see if he is having a new allergy reaction to this. He will go back on Effient, and stopped taking the Plavix. He will give [place] a call back next week to see, if he is feeling any better. If no change in his rash, he will need to followup with his primary care physician to see if his drug reaction is coming from medications prescribed by him to include the tramadol. His Tylenol dose has recently been increased from 50 mg every 6 hours 100 mg every 6 hours.  Uncertain if this  is a environmental or not. He was not having any issues with this rash until after discharge from the hospital.

## 2014-05-26 ENCOUNTER — Ambulatory Visit: Payer: BC Managed Care – PPO | Admitting: Cardiology

## 2014-06-24 ENCOUNTER — Encounter: Payer: BC Managed Care – PPO | Admitting: Cardiology

## 2014-06-24 NOTE — Progress Notes (Signed)
Opened in error

## 2014-07-15 ENCOUNTER — Encounter (HOSPITAL_COMMUNITY): Payer: Self-pay | Admitting: Cardiovascular Disease

## 2014-08-08 ENCOUNTER — Emergency Department (HOSPITAL_COMMUNITY)
Admission: EM | Admit: 2014-08-08 | Discharge: 2014-08-08 | Disposition: A | Discharge status: Home / Self Care | Payer: BC Managed Care – PPO | Attending: Emergency Medicine | Admitting: Emergency Medicine

## 2014-08-08 ENCOUNTER — Encounter (HOSPITAL_COMMUNITY): Payer: Self-pay | Admitting: *Deleted

## 2014-08-08 DIAGNOSIS — F1110 Opioid abuse, uncomplicated: Secondary | ICD-10-CM | POA: Diagnosis present

## 2014-08-08 DIAGNOSIS — Z862 Personal history of diseases of the blood and blood-forming organs and certain disorders involving the immune mechanism: Secondary | ICD-10-CM | POA: Insufficient documentation

## 2014-08-08 DIAGNOSIS — M109 Gout, unspecified: Secondary | ICD-10-CM | POA: Diagnosis not present

## 2014-08-08 DIAGNOSIS — Z791 Long term (current) use of non-steroidal anti-inflammatories (NSAID): Secondary | ICD-10-CM | POA: Insufficient documentation

## 2014-08-08 DIAGNOSIS — Z79899 Other long term (current) drug therapy: Secondary | ICD-10-CM | POA: Insufficient documentation

## 2014-08-08 DIAGNOSIS — G8929 Other chronic pain: Secondary | ICD-10-CM | POA: Insufficient documentation

## 2014-08-08 DIAGNOSIS — Z7982 Long term (current) use of aspirin: Secondary | ICD-10-CM | POA: Insufficient documentation

## 2014-08-08 DIAGNOSIS — I10 Essential (primary) hypertension: Secondary | ICD-10-CM | POA: Diagnosis not present

## 2014-08-08 DIAGNOSIS — F1120 Opioid dependence, uncomplicated: Secondary | ICD-10-CM | POA: Diagnosis not present

## 2014-08-08 DIAGNOSIS — Z9889 Other specified postprocedural states: Secondary | ICD-10-CM | POA: Insufficient documentation

## 2014-08-08 LAB — RAPID URINE DRUG SCREEN, HOSP PERFORMED
AMPHETAMINES: NOT DETECTED
Barbiturates: NOT DETECTED
Benzodiazepines: NOT DETECTED
Cocaine: NOT DETECTED
Opiates: POSITIVE — AB
TETRAHYDROCANNABINOL: NOT DETECTED

## 2014-08-08 MED ORDER — ONDANSETRON 4 MG PO TBDP: 4.0000 mg | ORAL_TABLET | ORAL | Status: DC | PRN

## 2014-08-08 MED ORDER — CLONIDINE HCL 0.1 MG PO TABS: ORAL_TABLET | ORAL | Status: DC

## 2014-08-08 MED ORDER — CLONIDINE HCL 0.1 MG PO TABS
0.1000 mg | ORAL_TABLET | Freq: Once | ORAL | Status: AC
  Administered 2014-08-08: 0.1 mg via ORAL
  Filled 2014-08-08: qty 1

## 2014-08-08 NOTE — ED Provider Notes (Signed)
CSN: [id number]     Arrival date & time 08/08/14  1416 History   First MD Initiated Contact with Patient 08/08/14 1713     Chief Complaint  Patient presents with  . Drug Problem  . Medical Clearance     (Consider location/radiation/quality/duration/timing/severity/associated sxs/prior Treatment) HPI The patient reports that he had been on oxycodone for approximately 4 years for chronic back pain. He reports for the past 1-2 years now he has been buying it off the street. He reports he takes about 6 10 mg OxyContin pills a day. He is here to the emergency department today seeking detox because his wife has told him that either he has to get help and treatment or they will be splitting up. She reports they have a 46 year old daughter and their daughter already knows way too much about addiction. The patient denies any use of IV drugs or alcohol. He reports he tried cocaine about 3 months ago but no other substance abuse. He reports when he starts to withdrawal he experiences upset stomach and agitation. He reports he develops insomnia. He denies he's been ill recently. He denies he's ever had suicidal ideation. He denies any thoughts of hurting anyone else. Past Medical History  Diagnosis Date  . Hypertension   . Pulmonary sarcoidosis     a. 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 H/O inguinal hernia repair   . Gout   . Obesity    Past Surgical History  Procedure Laterality Date  . Left heart catheterization with coronary angiogram N/A 02/03/2014    Procedure: LEFT HEART CATHETERIZATION WITH CORONARY ANGIOGRAM;  Surgeon: Iran Ouch, MD;  Location: [facility] CATH LAB;  Service: Cardiovascular;  Laterality: N/A;  . Left heart catheterization with coronary angiogram N/A 02/04/2014    Procedure: LEFT HEART CATHETERIZATION WITH CORONARY ANGIOGRAM;  Surgeon: Kathleene Hazel, MD;  Location: [facility] CATH LAB;  Service: Cardiovascular;  Laterality:  N/A;   Family History  Problem Relation Age of Onset  . Hypertension Mother   . Heart attack Father   . Diabetes Father   . Stroke Father   . Heart attack Maternal Grandfather   . Heart attack Paternal Grandfather   . Heart attack Cousin      stent placed in 40s   History  Substance Use Topics  . Smoking status: Never Smoker   . Smokeless tobacco: Never Used  . Alcohol Use: No    Review of Systems  10 Systems reviewed and are negative for acute change except as noted in the HPI.   Allergies  Naproxen  Home Medications   Prior to Admission medications   Medication Sig Start Date End Date Taking? Authorizing Provider  aspirin EC 81 MG EC tablet Take 1 tablet (81 mg total) by mouth daily. 02/05/14  Yes Rhonda G Barrett, PA-C  clonazePAM (KLONOPIN) 0.5 MG tablet Take 0.5 mg by mouth 2 (two) times daily as needed for anxiety.  04/05/14  Yes Historical Provider, MD  clopidogrel (PLAVIX) 75 MG tablet Take 1 tablet (75 mg total) by mouth daily. 04/09/14  Yes Jodelle Gross, NP  colchicine 0.6 MG tablet Take 0.6 mg by mouth daily as needed (pain).  03/27/14  Yes Historical Provider, MD  meloxicam (MOBIC) 15 MG tablet Take 15 mg by mouth daily.   Yes Historical Provider, MD  metoprolol succinate (TOPROL XL) 25 MG 24 hr tablet Take 0.5 tablets (12.5 mg total) by mouth daily. 02/23/14  Yes  Antoine Poche, MD  traMADol (ULTRAM) 50 MG tablet Take 100 mg by mouth every 6 (six) hours as needed for moderate pain.  04/05/14  Yes Historical Provider, MD  atorvastatin (LIPITOR) 80 MG tablet Take 1 tablet (80 mg total) by mouth daily at 6 PM. (04/02/2014 - HOLDING AS OF TODAY DUE TO RASH) Patient not taking: Reported on 08/08/2014 04/02/14   Antoine Poche, MD  cloNIDine (CATAPRES) 0.1 MG tablet 1 tab po tid x 2 days, then bid x 2 days, then once daily x 2 days 08/08/14   Arby Barrette, MD  nitroGLYCERIN (NITROSTAT) 0.4 MG SL tablet Place 1 tablet (0.4 mg total) under the tongue every 5 (five)  minutes as needed for chest pain. 02/05/14   Rhonda G Barrett, PA-C  ondansetron (ZOFRAN ODT) 4 MG disintegrating tablet Take 1 tablet (4 mg total) by mouth every 4 (four) hours as needed for nausea or vomiting. 08/08/14   Arby Barrette, MD   BP 134/82 mmHg  Pulse 71  Temp(Src) 97.9 F (36.6 C) (Oral)  Resp 16  SpO2 99% Physical Exam  Constitutional: He is oriented to person, place, and time.  Patient is morbidly obese. He is nontoxic. He does not have any acute respiratory distress. He is mildly agitated and anxious. His mental status is clear.  HENT:  Head: Normocephalic and atraumatic.  Eyes: Conjunctivae and EOM are normal. Pupils are equal, round, and reactive to light.  Neck: Neck supple. No thyromegaly present.  Cardiovascular: Normal rate, regular rhythm, normal heart sounds and intact distal pulses.   Pulmonary/Chest: Effort normal and breath sounds normal. No respiratory distress. He has no wheezes. He has no rales.  Abdominal: Soft. Bowel sounds are normal. He exhibits no distension. There is no tenderness. There is no guarding.  Musculoskeletal: Normal range of motion. He exhibits no edema or tenderness.  Neurological: He is alert and oriented to person, place, and time. No cranial nerve deficit. Coordination normal.  Skin: No rash noted.  No track marks present.  Psychiatric:  The patient is mildly anxious but appropriate. He is slightly tearful.    ED Course  Procedures (including critical care time) Labs Review Labs Reviewed  URINE RAPID DRUG SCREEN (HOSP PERFORMED)    Imaging Review No results found.   EKG Interpretation None      MDM   Final diagnoses:  Narcotic addiction   The patient presents seeking treatment for oral opioid addiction. At this point [place] and provided to the patient. Also resource guide for [place] [facility] is provided. The patient does not have any suicidal ideation. His addiction is  causing familial disruption and the patient is actively seeking treatment. Patient has supportive family members present. Clonidine and Zofran prescription are provided.    Arby Barrette, MD 08/08/14 [phone]

## 2014-08-08 NOTE — ED Notes (Signed)
Pt reports wanting to detox from oxycodone pills.  Pt has been taking them for 4 or 5 years.  Pt started on vicodin for 2 years and oxycodone for 2 years.  Pt has spinal stenosis and was prescribed pills for 4 years and for the past year pt has been supplied oxycodone by "different people." Pt reports last pill Thursday night.  Pt reports nervousness, stomach ache.  Pt denies n/v/d, h/a or tremor

## 2014-08-08 NOTE — Discharge Instructions (Signed)
Finding Treatment for Alcohol and Drug Addiction It can be hard to find the right place to get professional treatment. Here are some important things to consider:  There are different types of treatment to choose from.  Some programs are live-in (residential) while others are not (outpatient). Sometimes a combination is offered.  No single type of program is right for everyone.  Most treatment programs involve a combination of education, counseling, and a 12-step, spiritually-based approach.  There are non-spiritually based programs (not 12-step).  Some treatment programs are government sponsored. They are geared for patients without private insurance.  Treatment programs can vary in many respects such as:  Cost and types of insurance accepted.  Types of on-site medical services offered.  Length of stay, setting, and size.  Overall philosophy of treatment. A person may need specialized treatment or have needs not addressed by all programs. For example, adolescents need treatment appropriate for their age. Other people have secondary disorders that must be managed as well. Secondary conditions can include mental illness, such as depression or diabetes. Often, a period of detoxification from alcohol or drugs is needed. This requires medical supervision and not all programs offer this. THINGS TO CONSIDER WHEN SELECTING A TREATMENT PROGRAM   Is the program certified by the appropriate government agency? Even private programs must be certified and employ certified professionals.  Does the program accept your insurance? If not, can a payment plan be set up?  Is the facility clean, organized, and well run? Do they allow you to speak with graduates who can share their treatment experience with you? Can you tour the facility? Can you meet with staff?  Does the program meet the full range of individual needs?  Does the treatment program address sexual orientation and physical  disabilities? Do they provide age, gender, and culturally appropriate treatment services?  Is treatment available in languages other than English?  Is long-term aftercare support or guidance encouraged and provided?  Is assessment of an individual's treatment plan ongoing to ensure it meets changing needs?  Does the program use strategies to encourage reluctant patients to remain in treatment long enough to increase the likelihood of success?  Does the program offer counseling (individual or group) and other behavioral therapies?  Does the program offer medicine as part of the treatment regimen, if needed?  Is there ongoing monitoring of possible relapse? Is there a defined relapse prevention program? Are services or referrals offered to family members to ensure they understand addiction and the recovery process? This would help them support the recovering individual.  Are 12-step meetings held at the center or is transport available for patients to attend outside meetings? In countries outside of the [place]. and [place], Magazine features editor for contact information for services in your area. Document Released: 06/21/2005 Document Revised: 10/15/2011 Document Reviewed: 01/01/2008 [facility] Patient Information 2015 [place], [place]. This information is not intended to replace advice given to you by your health care provider. Make sure you discuss any questions you have with your health care provider. Opioid Use Disorder Opioid use disorder is a mental disorder. It is the continued nonmedical use of opioids in spite of risks to health and well-being. Misused opioids include the street drug heroin. They also include pain medicines such as morphine, hydrocodone, oxycodone, and fentanyl. Opioids are very addictive. People who misuse opioids get an exaggerated feeling of well-being. Opioid use disorder often disrupts activities at home, work, or school. It may cause mental or physical problems.  A family  history of opioid use disorder puts you at higher risk of it. People with opioid use disorder often misuse other drugs or have mental illness such as depression, posttraumatic stress disorder, or antisocial personality disorder. They also are at risk of suicide and death from overdose. SIGNS AND SYMPTOMS  Signs and symptoms of opioid use disorder include: Use of opioids in larger amounts or over a longer period than intended. Unsuccessful attempts to cut down or control opioid use. A lot of time spent obtaining, using, or recovering from the effects of opioids. A strong desire or urge to use opioids (craving). Continued use of opioids in spite of major problems at work, school, or home because of use. Continued use of opioids in spite of relationship problems because of use. Giving up or cutting down on important life activities because of opioid use. Use of opioids over and over in situations when it is physically hazardous, such as driving a car. Continued use of opioids in spite of a physical problem that is likely related to use. Physical problems can include: Severe constipation. Poor nutrition. Infertility. Tuberculosis. Aspiration pneumonia. Infections such as human immunodeficiency virus (HIV) and hepatitis (from injecting opioids). Continued use of opioids in spite of a mental problem that is likely related to use. Mental problems can include: Depression. Anxiety. Hallucinations. Sleep problems. Loss of sexual function. Need to use more and more opioids to get the same effect, or lessened effect over time with use of the same amount (tolerance). Having withdrawal symptoms when opioid use is stopped, or using opioids to reduce or avoid withdrawal symptoms. Withdrawal symptoms include: Depressed, anxious, or irritable mood. Nausea, vomiting, diarrhea, or intestinal cramping. Muscle aches or spasms. Excessive tearing or runny nose. Dilated pupils, sweating, or hairs standing on  end. Yawning. Fever, raised blood pressure, or fast pulse. Restlessness or trouble sleeping. This does not apply to people taking opioids for medical reasons only. DIAGNOSIS Opioid use disorder is diagnosed by your health care provider. You may be asked questions about your opioid use and and how it affects your life. A physical exam may be done. A drug screen may be ordered. You may be referred to a mental health professional. The diagnosis of opioid use disorder requires at least two symptoms within 12 months. The type of opioid use disorder you have depends on the number of signs and symptoms you have. The type may be: Mild. Two or three signs and symptoms.  Moderate. Four or five signs and symptoms.  Severe. Six or more signs and symptoms. TREATMENT  Treatment is usually provided by mental health professionals with training in substance use disorders.The following options are available: Detoxification.This is the first step in treatment for withdrawal. It is medically supervised withdrawal with the use of medicines. These medicines lessen withdrawal symptoms. They also raise the chance of becoming opioid free. Counseling, also known as talk therapy. Talk therapy addresses the reasons you use opioids. It also addresses ways to keep you from using again (relapse). The goals of talk therapy are to avoid relapse by: Identifying and avoiding triggers for use. Finding healthy ways to cope with stress. Learning how to handle cravings. Support groups. Support groups provide emotional support, advice, and guidance. A medicine that blocks opioid receptors in your brain. This medicine can reduce opioid cravings that lead to relapse. This medicine also blocks the desired opioid effect when relapse occurs. Opioids that are taken by mouth in place of the misused opioid (opioid maintenance treatment). These medicines satisfy  cravings but are safer than commonly misused opioids. This often is the best  option for people who continue to relapse with other treatments. HOME CARE INSTRUCTIONS  Take medicines only as directed by your health care provider. Check with your health care provider before starting new medicines. Keep all follow-up visits as directed by your health care provider. SEEK MEDICAL CARE IF: You are not able to take your medicines as directed. Your symptoms get worse. SEEK IMMEDIATE MEDICAL CARE IF: You have serious thoughts about hurting yourself or others. You may have taken an overdose of opioids. FOR MORE INFORMATION National Institute on Drug Abuse: http://www.price-smith.com/ Substance Abuse and Mental Health Services Administration: SkateOasis.com.pt Document Released: 05/20/2007 Document Revised: 12/07/2013 Document Reviewed: 08/05/2013 [facility] Patient Information 2015 [place], [place]. This information is not intended to replace advice given to you by your health care provider. Make sure you discuss any questions you have with your health care provider.   Emergency Department Resource Guide 1) Find a Doctor and Pay Out of Pocket Although you won't have to find out who is covered by your insurance plan, it is a good idea to ask around and get recommendations. You will then need to call the office and see if the doctor you have chosen will accept you as a new patient and what types of options they offer for patients who are self-pay. Some doctors offer discounts or will set up payment plans for their patients who do not have insurance, but you will need to ask so you aren't surprised when you get to your appointment.  2) Contact Your Local Health Department Not all health departments have doctors that can see patients for sick visits, but many do, so it is worth a call to see if yours does. If you don't know where your local health department is, you can check in your phone book. The CDC also has a tool to help you locate your state's health department, and many state websites also  have listings of all of their local health departments.  3) Find a Walk-in Clinic If your illness is not likely to be very severe or complicated, you may want to try a walk in clinic. These are popping up all over the country in pharmacies, drugstores, and shopping centers. They're usually staffed by nurse practitioners or physician assistants that have been trained to treat common illnesses and complaints. They're usually fairly quick and inexpensive. However, if you have serious medical issues or chronic medical problems, these are probably not your best option.  No Primary Care Doctor: - Call Health Connect at  [phone] - they can help you locate a primary care doctor that  accepts your insurance, provides certain services, etc. - Physician Referral Service- [phone]  Chronic Pain Problems: Organization         Address  Phone   Notes  [facility]  [phone] Patients need to be referred by their primary care doctor.   Medication Assistance: Organization         Address  Phone   Notes  [facility] Medication [facility] [address] [place] [place], [place] [address] [phone] --Must be a resident of [facility] -- Must have NO insurance coverage whatsoever (no Medicaid/ Medicare, etc.) -- The pt. MUST have a primary care doctor that directs their care regularly and follows them in the community   MedAssist  [phone]   [employer]  [phone]    Agencies that provide  inexpensive medical care: Organization         Address  Phone   Notes  [name]  [phone]   [name]    [phone]   [facility] [address] [place], [place] [address] [phone]   [facility] [address] [place] [place] [phone]   Planned Parenthood    [phone]   [facility]    [phone]   [facility]  [address], [place] Phone:  [phone], Fax:  [phone] Hours of Operation:  9 am - 6 pm, M-F.  Also accepts Medicaid/Medicare and self-pay.  [facility]  [address], [place] Phone: [phone], Fax: [phone]. Hours of Operation:  8:30 am - 5:30 pm, M-F.  Also accepts Medicaid and self-pay.  [facility] [place] [address] [place] Phone: [phone]   [facility] [address] Natasha Bence [place], [place] [phone], Ext. [phone] Mondays & Thursdays: 7-9 AM.  First 15 patients are seen on a first come, first serve basis.    Medicaid-accepting [facility] Providers:  Organization         Address  Phone   Notes  [facility] [address], [place] [phone] Also accepts self-pay patients.  [facility] [address] Laurell Josephs [place], [place]  [phone]   [facility] [address], [place] [phone]   [facility] [address], [place] [phone]   Renaye Rakers [address], [place]   [phone] Only accepts [place] Access [place] patients after they have their name applied to their card.   Self-Pay (no insurance) in [facility]:  Organization         Address  Phone   Notes  Sickle Cell Patients, [facility] Internal Medicine [address] [place], [place] [phone]   [facility] [address] [place], [place] [phone]   [name] [place]  [address], [place] [phone]   [facility]/Dr. Osei-Bonsu  [address], [place] or [address], [place] [phone] Phone number for both [place] and [place] locations is the same.  [facility] [address] [place] [phone]   [facility] [address],  [place] or [address] [phone]   [facility] [address], [place] [phone], phone; [phone], fax Sees patients 1st and 3rd Saturday of every month.  Must not qualify for public or private insurance (i.e. Medicaid, Medicare, [place] Health Choice, Veterans' Benefits)  Household income should be no more than 200% of the poverty level The clinic cannot treat you if you are pregnant or think you are pregnant  Sexually transmitted diseases are not treated at the clinic.    Dental Care: Organization         Address  Phone  Notes  [facility] Department of [facility] [address] [place], [place] [phone] Accepts children up to age 88 who are enrolled in [place] or [place] Health Choice; pregnant women with a Medicaid card; and children who have applied for Medicaid or [place] Health Choice,  but were declined, whose parents can pay a reduced fee at time of service.  [facility] Department of [facility]  [address], [place] [phone] Accepts children up to age 70 who are enrolled in [place] or [place] Health Choice; pregnant women with a Medicaid card; and children who have applied for Medicaid or [place] Health Choice, but were declined, whose parents can pay a reduced fee at time of service.  [facility]  [address] [place], [place] [phone] Patients are seen by appointment only. Walk-ins are not accepted. [facility] will see patients 31 years of age and older. Monday - Tuesday (8am-5pm) Most Wednesdays (8:30-5pm) $30 per visit, cash only  [facility]  [address], [place] [facility] [phone] Patients are seen by appointment only. Walk-ins are not accepted. [facility] will see patients 105 years of age and older. One Wednesday Evening (Monthly: Volunteer Based).  $30 per visit, cash only  [employer]  [phone] for adults; Children under age 63, call Graduate Pediatric Dentistry at [phone]. Children aged 34-14, please call [phone] to request a pediatric application.  Dental services are provided in all areas of dental care including fillings, crowns and bridges, complete and partial dentures, implants, gum treatment, root canals, and extractions. Preventive care is also provided. Treatment is provided to both adults and children. Patients are selected via a lottery and there is often a waiting list.   [facility] [address], [place]  [phone] [email or web address]   [facility] [address] [place], [place] [phone], Ext. [phone] Second and Fourth Thursday of each month, opens at 6:30 AM; Clinic ends at 9 AM.  Patients are seen on a first-come first-served basis, and a limited number are seen during each clinic.   [facility]  [address] Ether Griffins [place], [place] [phone]   Eligibility Requirements You must have lived in [place], [place], or [place] counties for at least the last three months.   You cannot be eligible for state or federal sponsored [place], including [employer], [place], or [employer].   You generally cannot be eligible for healthcare insurance through your employer.    How to apply: Eligibility screenings are held every Tuesday and Wednesday afternoon from 1:00 pm until 4:00 pm. You do not need an appointment for the interview!  [facility] [address], [place], [place] [phone]   [facility] Health Department  [phone]   [facility] Health Department  [phone]   [facility] Health Department  [phone]    Behavioral Health Resources in the Community: Intensive Outpatient Programs Organization         Address  Phone  Notes  [facility] [address], [place], [place] [phone]     [facility] Outpatient [address], [place], [place] [phone]   [facility] [address] [place], [place]  [phone]   [facility] Mental Health [address]  [place], [place] [phone] or [phone]   Substance Abuse Resources Organization         Address  Phone  Notes  Alcohol [facility] Drug Services  [phone]   [facility]  [phone]   [facility]  [phone]   Floydene Flock  [phone]   Residential & Outpatient Substance Abuse Program  3231276755  Psychological Services Organization         Address  Phone  Notes  [facility] Health  [phone]   [facility]  [phone]   [facility] [phone] [address], [place] [phone] or [phone]    Mobile Crisis Teams Organization         Address  Phone  Notes  [facility], Mobile Crisis Care Unit  [phone]   [facility]  [address] [place], [place] [phone]   Doristine Locks [address] [place] [phone]    Self-Help/Support Groups Organization         Address  Phone             Notes  [facility] [place] - variety of support groups  [phone]- I7437963 Call for more information  Narcotics Anonymous (NA), [facility] [address], [employer] [place]  2 meetings at this location   Statistician         Address  Phone  Notes  [facility] [address],    [place]  [phone]   [facility]  [address] [place] [address], [place], [place] [phone]   [facility] Treatment Facility [address] [place], [place] [phone] Admissions: 8am-3pm M-F  [facility] [address]    [place], [place] [phone]   [facility] [address] [place], [place], [place] [phone]   [facility] [address]  [place],  [place] [phone]   [facility] - Intensive Outpatient [address] Laurell Josephs [address], [place], [place] [phone]   [facility] [address] [place]  [place], [place] [phone] or [phone]   [facility] ([facility]) [address], [place], [place] [phone] Accepts Medicaid  [facility] [address]  [place] [phone] Substance Abuse/Addiction Treatment   [facility] Organization         Address  Phone  Notes  [facility]  [phone]   Angie Fava, PhD [address] Ervin Knack [place], [place]   [phone] or [phone]   [facility] Behavioral   [address] [place], [place] [phone]   [facility] [address], [place], [place] [phone] Insurance/Medicaid/sponsorship through [facility] and Families [address]                                    [place], [place] [phone] Therapy/tele-psych/case  [facility] [address], [place] [phone]    Dr. Lolly Mustache  [phone]   [facility]  United [employer] [facility] Dept. 1) [address], [place] 2) [address] [place] 3)  [address], [place] [phone]  [phone]   [facility] Child Abuse Hotline [phone] or [phone] (After Hours)

## 2014-08-08 NOTE — ED Notes (Signed)
Pt made aware to return if symptoms worsen or if any life threatening symptoms occur.   

## 2014-08-08 NOTE — BHH Counselor (Signed)
Per Dr Fabian Sharp request, writer faxed info to [phone] for substance abuse treatment facilities in [place] and in [employer]. Per chart review, pt has private insurance so pt is able to go to facilities outside [employer].   Evette Cristal, [place] Assessment Counselor

## 2014-08-08 NOTE — ED Notes (Signed)
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 Denies SI or HI.

## 2014-08-10 ENCOUNTER — Emergency Department: Payer: Self-pay | Admitting: Emergency Medicine

## 2014-08-10 LAB — DRUG SCREEN, URINE

## 2014-08-10 LAB — URINALYSIS, COMPLETE
Bacteria: NONE SEEN
Bilirubin,UR: NEGATIVE
Blood: NEGATIVE
Glucose,UR: NEGATIVE mg/dL (ref 0–75)
Ketone: NEGATIVE
Leukocyte Esterase: NEGATIVE
Nitrite: NEGATIVE
Ph: 5 (ref 4.5–8.0)
Protein: NEGATIVE
RBC,UR: 1 /HPF (ref 0–5)
Specific Gravity: 1.024 (ref 1.003–1.030)
Squamous Epithelial: NONE SEEN
WBC UR: 1 /HPF (ref 0–5)

## 2014-08-10 LAB — CBC
HCT: 46.4 % (ref 40.0–52.0)
HGB: 15.2 g/dL (ref 13.0–18.0)
MCH: 29 pg (ref 26.0–34.0)
MCHC: 32.7 g/dL (ref 32.0–36.0)
MCV: 89 fL (ref 80–100)
Platelet: 218 10*3/uL (ref 150–440)
RBC: 5.23 10*6/uL (ref 4.40–5.90)
RDW: 13.9 % (ref 11.5–14.5)
WBC: 10.7 10*3/uL — ABNORMAL HIGH (ref 3.8–10.6)

## 2014-08-10 LAB — LIPASE, BLOOD: Lipase: 178 U/L (ref 73–393)

## 2014-08-10 LAB — COMPREHENSIVE METABOLIC PANEL
Albumin: 3.3 g/dL — ABNORMAL LOW (ref 3.4–5.0)
Alkaline Phosphatase: 135 U/L — ABNORMAL HIGH
Anion Gap: 6 — ABNORMAL LOW (ref 7–16)
BUN: 12 mg/dL (ref 7–18)
Bilirubin,Total: 0.3 mg/dL (ref 0.2–1.0)
Calcium, Total: 8.9 mg/dL (ref 8.5–10.1)
Chloride: 107 mmol/L (ref 98–107)
Co2: 28 mmol/L (ref 21–32)
Creatinine: 1.11 mg/dL (ref 0.60–1.30)
EGFR (African American): >60
EGFR (Non-African Amer.): >60
Glucose: 94 mg/dL (ref 65–99)
Osmolality: 281 (ref 275–301)
Potassium: 3.9 mmol/L (ref 3.5–5.1)
SGOT(AST): 24 U/L (ref 15–37)
SGPT (ALT): 23 U/L
Sodium: 141 mmol/L (ref 136–145)
Total Protein: 7.3 g/dL (ref 6.4–8.2)

## 2014-08-10 LAB — SALICYLATE LEVEL: Salicylates, Serum: <1.7 mg/dL

## 2014-08-10 LAB — ETHANOL: Ethanol: <3 mg/dL

## 2014-08-10 LAB — ACETAMINOPHEN LEVEL

## 2014-11-29 ENCOUNTER — Encounter (HOSPITAL_COMMUNITY): Payer: Self-pay | Admitting: *Deleted

## 2014-11-29 ENCOUNTER — Emergency Department (HOSPITAL_COMMUNITY)
Admission: EM | Admit: 2014-11-29 | Discharge: 2014-11-30 | Disposition: A | Discharge status: Home / Self Care | Payer: Self-pay | Attending: Emergency Medicine | Admitting: Emergency Medicine

## 2014-11-29 DIAGNOSIS — Z9889 Other specified postprocedural states: Secondary | ICD-10-CM | POA: Insufficient documentation

## 2014-11-29 DIAGNOSIS — Z7902 Long term (current) use of antithrombotics/antiplatelets: Secondary | ICD-10-CM | POA: Insufficient documentation

## 2014-11-29 DIAGNOSIS — E669 Obesity, unspecified: Secondary | ICD-10-CM | POA: Insufficient documentation

## 2014-11-29 DIAGNOSIS — I252 Old myocardial infarction: Secondary | ICD-10-CM | POA: Insufficient documentation

## 2014-11-29 DIAGNOSIS — K279 Peptic ulcer, site unspecified, unspecified as acute or chronic, without hemorrhage or perforation: Secondary | ICD-10-CM | POA: Insufficient documentation

## 2014-11-29 DIAGNOSIS — Z7982 Long term (current) use of aspirin: Secondary | ICD-10-CM | POA: Insufficient documentation

## 2014-11-29 DIAGNOSIS — I10 Essential (primary) hypertension: Secondary | ICD-10-CM | POA: Insufficient documentation

## 2014-11-29 DIAGNOSIS — Z862 Personal history of diseases of the blood and blood-forming organs and certain disorders involving the immune mechanism: Secondary | ICD-10-CM | POA: Insufficient documentation

## 2014-11-29 DIAGNOSIS — Z79899 Other long term (current) drug therapy: Secondary | ICD-10-CM | POA: Insufficient documentation

## 2014-11-29 DIAGNOSIS — R109 Unspecified abdominal pain: Secondary | ICD-10-CM

## 2014-11-29 DIAGNOSIS — Z791 Long term (current) use of non-steroidal anti-inflammatories (NSAID): Secondary | ICD-10-CM | POA: Insufficient documentation

## 2014-11-29 DIAGNOSIS — R609 Edema, unspecified: Secondary | ICD-10-CM | POA: Insufficient documentation

## 2014-11-29 DIAGNOSIS — Z8739 Personal history of other diseases of the musculoskeletal system and connective tissue: Secondary | ICD-10-CM | POA: Insufficient documentation

## 2014-11-29 LAB — CBC WITH DIFFERENTIAL/PLATELET
Basophils Absolute: 0 10*3/uL (ref 0.0–0.1)
Basophils Relative: 0 % (ref 0–1)
EOS ABS: 0.3 10*3/uL (ref 0.0–0.7)
Eosinophils Relative: 3 % (ref 0–5)
HEMATOCRIT: 44.7 % (ref 39.0–52.0)
Hemoglobin: 15.1 g/dL (ref 13.0–17.0)
Lymphocytes Relative: 20 % (ref 12–46)
Lymphs Abs: 2.2 10*3/uL (ref 0.7–4.0)
MCH: 29.4 pg (ref 26.0–34.0)
MCHC: 33.8 g/dL (ref 30.0–36.0)
MCV: 87 fL (ref 78.0–100.0)
Monocytes Absolute: 0.9 10*3/uL (ref 0.1–1.0)
Monocytes Relative: 8 % (ref 3–12)
NEUTROS ABS: 7.9 10*3/uL — AB (ref 1.7–7.7)
Neutrophils Relative %: 69 % (ref 43–77)
Platelets: 244 10*3/uL (ref 150–400)
RBC: 5.14 MIL/uL (ref 4.22–5.81)
RDW: 12.9 % (ref 11.5–15.5)
WBC: 11.3 10*3/uL — ABNORMAL HIGH (ref 4.0–10.5)

## 2014-11-29 MED ORDER — HYDROMORPHONE HCL 1 MG/ML IJ SOLN
1.0000 mg | Freq: Once | INTRAMUSCULAR | Status: AC
  Administered 2014-11-29: 1 mg via INTRAVENOUS
  Filled 2014-11-29: qty 1

## 2014-11-29 MED ORDER — ONDANSETRON HCL 4 MG/2ML IJ SOLN
4.0000 mg | Freq: Once | INTRAMUSCULAR | Status: AC
  Administered 2014-11-29: 4 mg via INTRAVENOUS
  Filled 2014-11-29: qty 2

## 2014-11-29 MED ORDER — SODIUM CHLORIDE 0.9 % IV SOLN: 1000.0000 mL | INTRAVENOUS | Status: DC

## 2014-11-29 MED ORDER — PANTOPRAZOLE SODIUM 40 MG IV SOLR
40.0000 mg | Freq: Once | INTRAVENOUS | Status: AC
  Administered 2014-11-29: 40 mg via INTRAVENOUS
  Filled 2014-11-29: qty 40

## 2014-11-29 MED ORDER — SODIUM CHLORIDE 0.9 % IV SOLN
1000.0000 mL | Freq: Once | INTRAVENOUS | Status: AC
  Administered 2014-11-30: 1000 mL via INTRAVENOUS

## 2014-11-29 MED ORDER — SODIUM CHLORIDE 0.9 % IV SOLN
1000.0000 mL | Freq: Once | INTRAVENOUS | Status: AC
  Administered 2014-11-29: 1000 mL via INTRAVENOUS

## 2014-11-29 NOTE — ED Provider Notes (Signed)
CSN: [id number]     Arrival date & time 11/29/14  1847 History  This chart was scribed for Dione Boozeavid Minahil Quinlivan, MD by Tanda RockersMargaux Venter, ED Scribe. This patient was seen in room APA06/APA06 and the patient's care was started at 10:59 PM.     Chief Complaint  Patient presents with  . Abdominal Pain  . Nausea   The history is provided by the patient. No language interpreter was used.    HPI Comments: Brett Harper is a 44 y.o. male with hx HTN who presents to the Emergency Department complaining of constant, severe epigastric pain that began 1 week ago. He rates the pain as a 7/10 on the pain scale. Pt states that he began vomiting yesterday. He notes small amounts of blood in the vomit as well as a dark greenish color. He has been vomiting today as well. 7/10 on the pain scale. Pt denies modifying factors to the pain. He has tried antacids and  Prilosec without relief. He denies constipation, diarrhea, chest pain, back pain, shoulder pain, or any other symptoms. Pt is nonsmoker and denies EtOH use.  PCP - [name]  Past Medical History  Diagnosis Date  . Hypertension   . Pulmonary sarcoidosis     a. 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 H/O inguinal hernia repair   . Gout   . Obesity   . MI, old    Past Surgical History  Procedure Laterality Date  . Left heart catheterization with coronary angiogram N/A 02/03/2014    Procedure: LEFT HEART CATHETERIZATION WITH CORONARY ANGIOGRAM;  Surgeon: Iran OuchMuhammad A Arida, MD;  Location: [facility] CATH LAB;  Service: Cardiovascular;  Laterality: N/A;  . Left heart catheterization with coronary angiogram N/A 02/04/2014    Procedure: LEFT HEART CATHETERIZATION WITH CORONARY ANGIOGRAM;  Surgeon: Kathleene Hazelhristopher D McAlhany, MD;  [facility];  Service: Cardiovascular;  Laterality: N/A;   Family History  Problem Relation Age of Onset  . Hypertension Mother   . Heart attack Father   . Diabetes Father   .  Stroke Father   . Heart attack Maternal Grandfather   . Heart attack Paternal Grandfather   . Heart attack Cousin      stent placed in 40s   History  Substance Use Topics  . Smoking status: Never Smoker   . Smokeless tobacco: Never Used  . Alcohol Use: No    Review of Systems  Cardiovascular: Negative for chest pain.  Gastrointestinal: Positive for nausea, vomiting and abdominal pain. Negative for diarrhea and constipation.       Positive for hematemesis.   Musculoskeletal: Negative for back pain.       Negative for shoulder pain.   All other systems reviewed and are negative.     Allergies  Naproxen  Home Medications   Prior to Admission medications   Medication Sig Start Date End Date Taking? Authorizing Provider  aspirin EC 81 MG EC tablet Take 1 tablet (81 mg total) by mouth daily. 02/05/14  Yes Rhonda G Barrett, PA-C  meloxicam (MOBIC) 15 MG tablet Take 15 mg by mouth daily.   Yes Historical Provider, MD  metoprolol succinate (TOPROL XL) 25 MG 24 hr tablet Take 0.5 tablets (12.5 mg total) by mouth daily. Patient taking differently: Take 25 mg by mouth daily.  02/23/14  Yes Antoine PocheJonathan F Branch, MD  nitroGLYCERIN (NITROSTAT) 0.4 MG SL tablet Place 1 tablet (0.4 mg total) under the tongue every 5 (five) minutes  as needed for chest pain. 02/05/14  Yes Rhonda G Barrett, PA-C  omeprazole (PRILOSEC OTC) 20 MG tablet Take 20 mg by mouth daily as needed (for aci reflux).   Yes Historical Provider, MD  cloNIDine (CATAPRES) 0.1 MG tablet 1 tab po tid x 2 days, then bid x 2 days, then once daily x 2 days Patient not taking: Reported on 11/29/2014 08/08/14   Arby Barrette, MD  clopidogrel (PLAVIX) 75 MG tablet Take 1 tablet (75 mg total) by mouth daily. Patient not taking: Reported on 11/29/2014 04/09/14   Jodelle Gross, NP  ondansetron (ZOFRAN ODT) 4 MG disintegrating tablet Take 1 tablet (4 mg total) by mouth every 4 (four) hours as needed for nausea or vomiting. 08/08/14   Arby Barrette,  MD  traMADol (ULTRAM) 50 MG tablet Take 100 mg by mouth every 6 (six) hours as needed for moderate pain.  04/05/14   Historical Provider, MD   Triage Vitals: BP 154/91 mmHg  Pulse 70  Temp(Src) 97.8 F (36.6 C) (Oral)  Resp 20  Ht 5\' 11"  (1.803 m)  Wt 305 lb (138.347 kg)  BMI 42.56 kg/m2  SpO2 100%   Physical Exam  Constitutional: He is oriented to person, place, and time. He appears well-developed and well-nourished. No distress.  HENT:  Head: Normocephalic and atraumatic.  Eyes: Conjunctivae and EOM are normal. Pupils are equal, round, and reactive to light. No scleral icterus.  Neck: Normal range of motion. Neck supple. No JVD present.  Cardiovascular: Normal rate, regular rhythm and normal heart sounds.   No murmur heard. Pulmonary/Chest: Effort normal and breath sounds normal. He has no wheezes. He has no rales. He exhibits no tenderness.  Abdominal: Soft. He exhibits no distension and no mass. There is tenderness (Moderate epigastric tenderness. ). There is no rebound and no guarding.  Bowel sounds decreased.   Musculoskeletal: Normal range of motion. He exhibits edema (Trace edema. ).  Lymphadenopathy:    He has no cervical adenopathy.  Neurological: He is alert and oriented to person, place, and time. No cranial nerve deficit. He exhibits normal muscle tone. Coordination normal.  Skin: Skin is warm and dry. No rash noted.  Psychiatric: He has a normal mood and affect. His behavior is normal. Judgment and thought content normal.  Nursing note and vitals reviewed.   ED Course  Procedures (including critical care time)  DIAGNOSTIC STUDIES: Oxygen Saturation is 100% on RA, normal by my interpretation.    COORDINATION OF CARE: 11:03 PM-Discussed treatment plan which includes CMP, Lipase, CBC, Lactic acid, UA with pt at bedside and pt agreed to plan.   Labs Review Results for orders placed or performed during the hospital encounter of 11/29/14  Comprehensive metabolic  panel  Result Value Ref Range   Sodium 139 135 - 145 mmol/L   Potassium 3.9 3.5 - 5.1 mmol/L   Chloride 106 96 - 112 mmol/L   CO2 27 19 - 32 mmol/L   Glucose, Bld 90 70 - 99 mg/dL   BUN 14 6 - 23 mg/dL   Creatinine, Ser 1.61 0.50 - 1.35 mg/dL   Calcium 9.0 8.4 - 09.6 mg/dL   Total Protein 7.3 6.0 - 8.3 g/dL   Albumin 3.8 3.5 - 5.2 g/dL   AST 20 0 - 37 U/L   ALT 20 0 - 53 U/L   Alkaline Phosphatase 113 39 - 117 U/L   Total Bilirubin 0.5 0.3 - 1.2 mg/dL   GFR calc non Af Amer >90 >90  mL/min   GFR calc Af Amer >90 >90 mL/min   Anion gap 6 5 - 15  Lipase, blood  Result Value Ref Range   Lipase 25 11 - 59 U/L  CBC with Differential  Result Value Ref Range   WBC 11.3 (H) 4.0 - 10.5 K/uL   RBC 5.14 4.22 - 5.81 MIL/uL   Hemoglobin 15.1 13.0 - 17.0 g/dL   HCT 95.6 21.3 - 08.6 %   MCV 87.0 78.0 - 100.0 fL   MCH 29.4 26.0 - 34.0 pg   MCHC 33.8 30.0 - 36.0 g/dL   RDW 57.8 46.9 - 62.9 %   Platelets 244 150 - 400 K/uL   Neutrophils Relative % 69 43 - 77 %   Neutro Abs 7.9 (H) 1.7 - 7.7 K/uL   Lymphocytes Relative 20 12 - 46 %   Lymphs Abs 2.2 0.7 - 4.0 K/uL   Monocytes Relative 8 3 - 12 %   Monocytes Absolute 0.9 0.1 - 1.0 K/uL   Eosinophils Relative 3 0 - 5 %   Eosinophils Absolute 0.3 0.0 - 0.7 K/uL   Basophils Relative 0 0 - 1 %   Basophils Absolute 0.0 0.0 - 0.1 K/uL  Lactic acid, plasma  Result Value Ref Range   Lactic Acid, Venous 1.2 0.5 - 2.0 mmol/L  Urinalysis, Routine w reflex microscopic  Result Value Ref Range   Color, Urine YELLOW YELLOW   APPearance CLEAR CLEAR   Specific Gravity, Urine >1.030 (H) 1.005 - 1.030   pH 5.5 5.0 - 8.0   Glucose, UA NEGATIVE NEGATIVE mg/dL   Hgb urine dipstick NEGATIVE NEGATIVE   Bilirubin Urine NEGATIVE NEGATIVE   Ketones, ur NEGATIVE NEGATIVE mg/dL   Protein, ur NEGATIVE NEGATIVE mg/dL   Urobilinogen, UA 0.2 0.0 - 1.0 mg/dL   Nitrite NEGATIVE NEGATIVE   Leukocytes, UA NEGATIVE NEGATIVE   Imaging Review Ct Abdomen Pelvis W  Contrast  11/30/2014   CLINICAL DATA:  44 year old male with constant severe epigastric pain for the past week  EXAM: CT ABDOMEN AND PELVIS WITH CONTRAST  TECHNIQUE: Multidetector CT imaging of the abdomen and pelvis was performed using the standard protocol following bolus administration of intravenous contrast.  CONTRAST:  50mL OMNIPAQUE IOHEXOL 300 MG/ML SOLN, OMNIPAQUE IOHEXOL 300 MG/ML SOLN  COMPARISON:  Prior CT abdomen/ pelvis 08/22/2011  FINDINGS: Lower Chest: Visualized cardiac structures within normal limits for size. No pericardial effusion. Unremarkable distal thoracic esophagus. Dependent atelectasis in the lower lobes. Mild bronchial wall thickening.  Abdomen: Within these limitations, unremarkable CT appearance of the stomach. Nonspecific haziness of the proximal duodenum wall with inflammatory stranding in the peri duodenal fat. On the coronal view, there is a small focal outpouching medially in the proximal descending duodenum concerning for possible ulceration. There is a small periampullary duodenum diverticulum. Unremarkable CT appearance of the spleen, adrenal glands and pancreas. There is some blurring of the a lateral margin of the pancreatic head adjacent to the duodenum inflammation.  Normal hepatic contour morphology. No discrete hepatic lesion. Gallbladder is unremarkable. No intra or extrahepatic biliary ductal dilatation.  Unremarkable appearance of the bilateral kidneys. No focal solid lesion, hydronephrosis or nephrolithiasis. Lobulated renal contour bilaterally versus multiple small areas of renal cortical scarring. No evidence of obstruction or focal bowel wall thickening. Normal appendix in the right lower quadrant. The terminal ileum is unremarkable. No free fluid or suspicious adenopathy.  Pelvis: Unremarkable bladder, prostate gland and seminal vesicles. No free fluid or suspicious adenopathy.  Bones/Soft  Tissues: No acute fracture or aggressive appearing lytic or blastic  osseous lesion.  Vascular: No significant atherosclerotic vascular disease, aneurysmal dilatation or acute abnormality.  IMPRESSION: 1. Acute duodenitis with possible ulceration involving the D1 and proximal D2 segments. The primary differential consideration is peptic ulcer disease. Infectious and inflammatory duodenitis are less likely considerations. No evidence of perforation at this time. 2. Mild lower lobe bronchial wall thickening and dependent atelectasis. Differential considerations include both acute and chronic bronchitis as well as chronic inflammatory conditions such as asthma. 3. Persistent fetal lobation of the kidneys versus multiple areas of focal renal cortical scarring.   Electronically Signed   By: Malachy Moan M.D.   On: 11/30/2014 02:28      MDM   Final diagnoses:  Abdominal pain, unspecified abdominal location  Peptic ulcer disease    Epigastric pain of uncertain cause. Consider biliary disease, ulcer disease. He was given IV fluids and hydromorphone for pain. Laboratory workup is come back unremarkable but he continues to be very tender in the epigastric area. He is sent for CT of abdomen and pelvis which shows evidence of duodenitis which is most likely from peptic ulcer disease. He is given dose of pantoprazole. He does not have health insurance and is advised to use over-the-counter omeprazole. He is given to go packs of ondansetron and oxycodone-acetaminophen. He is referred to [facility] neurology for follow-up.   I personally performed the services described in this documentation, which was scribed in my presence. The recorded information has been reviewed and is accurate.       Dione Booze, MD 11/30/14 [id number]

## 2014-11-29 NOTE — ED Notes (Signed)
Pt reporting abdominal pain for approximately 1 week.  States vomiting began yesterday.  Reports pain in upper portion of abdomen.  States emesis is "dark green stuff".  Denies complaints of diarrhea.

## 2014-11-30 ENCOUNTER — Emergency Department (HOSPITAL_COMMUNITY): Payer: Self-pay

## 2014-11-30 LAB — URINALYSIS, ROUTINE W REFLEX MICROSCOPIC
BILIRUBIN URINE: NEGATIVE
GLUCOSE, UA: NEGATIVE mg/dL
HGB URINE DIPSTICK: NEGATIVE
Ketones, ur: NEGATIVE mg/dL
Leukocytes, UA: NEGATIVE
NITRITE: NEGATIVE
Protein, ur: NEGATIVE mg/dL
UROBILINOGEN UA: 0.2 mg/dL (ref 0.0–1.0)
pH: 5.5 (ref 5.0–8.0)

## 2014-11-30 LAB — COMPREHENSIVE METABOLIC PANEL
ALBUMIN: 3.8 g/dL (ref 3.5–5.2)
ALT: 20 U/L (ref 0–53)
ANION GAP: 6 (ref 5–15)
AST: 20 U/L (ref 0–37)
Alkaline Phosphatase: 113 U/L (ref 39–117)
BILIRUBIN TOTAL: 0.5 mg/dL (ref 0.3–1.2)
BUN: 14 mg/dL (ref 6–23)
CO2: 27 mmol/L (ref 19–32)
Calcium: 9 mg/dL (ref 8.4–10.5)
Chloride: 106 mmol/L (ref 96–112)
Creatinine, Ser: 0.87 mg/dL (ref 0.50–1.35)
GFR calc non Af Amer: >90 mL/min (ref >90)
Glucose, Bld: 90 mg/dL (ref 70–99)
Potassium: 3.9 mmol/L (ref 3.5–5.1)
Sodium: 139 mmol/L (ref 135–145)
TOTAL PROTEIN: 7.3 g/dL (ref 6.0–8.3)

## 2014-11-30 LAB — LIPASE, BLOOD: Lipase: 25 U/L (ref 11–59)

## 2014-11-30 LAB — LACTIC ACID, PLASMA: LACTIC ACID, VENOUS: 1.2 mmol/L (ref 0.5–2.0)

## 2014-11-30 MED ORDER — IOHEXOL 300 MG/ML  SOLN
50.0000 mL | Freq: Once | INTRAMUSCULAR | Status: AC | PRN
  Administered 2014-11-30: 50 mL via ORAL

## 2014-11-30 MED ORDER — IOHEXOL 300 MG/ML  SOLN
100.0000 mL | Freq: Once | INTRAMUSCULAR | Status: AC | PRN
  Administered 2014-11-30: 100 mL via INTRAVENOUS

## 2014-11-30 MED ORDER — ONDANSETRON HCL 4 MG PO TABS: 4.0000 mg | ORAL_TABLET | Freq: Four times a day (QID) | ORAL | Status: DC | PRN

## 2014-11-30 MED ORDER — METOCLOPRAMIDE HCL 5 MG/ML IJ SOLN
10.0000 mg | Freq: Once | INTRAMUSCULAR | Status: AC
  Administered 2014-11-30: 10 mg via INTRAVENOUS
  Filled 2014-11-30: qty 2

## 2014-11-30 MED ORDER — OXYCODONE-ACETAMINOPHEN 5-325 MG PO TABS: 1.0000 | ORAL_TABLET | ORAL | Status: DC | PRN

## 2014-11-30 MED ORDER — HYDROMORPHONE HCL 1 MG/ML IJ SOLN
1.0000 mg | Freq: Once | INTRAMUSCULAR | Status: AC
  Administered 2014-11-30: 1 mg via INTRAVENOUS
  Filled 2014-11-30: qty 1

## 2014-11-30 NOTE — Discharge Instructions (Signed)
Take omeprazole (Prilosec OTC) once a day for the next eight weeks.  Peptic Ulcer A peptic ulcer is a sore in the lining of your esophagus (esophageal ulcer), stomach (gastric ulcer), or in the first part of your small intestine (duodenal ulcer). The ulcer causes erosion into the deeper tissue. CAUSES  Normally, the lining of the stomach and the small intestine protects itself from the acid that digests food. The protective lining can be damaged by:  An infection caused by a bacterium called Helicobacter pylori (H. pylori).  Regular use of nonsteroidal anti-inflammatory drugs (NSAIDs), such as ibuprofen or aspirin.  Smoking tobacco. Other risk factors include being older than 50, drinking alcohol excessively, and having a family history of ulcer disease.  SYMPTOMS   Burning pain or gnawing in the area between the chest and the belly button.  Heartburn.  Nausea and vomiting.  Bloating. The pain can be worse on an empty stomach and at night. If the ulcer results in bleeding, it can cause:  Black, tarry stools.  Vomiting of bright red blood.  Vomiting of coffee-ground-looking materials. DIAGNOSIS  A diagnosis is usually made based upon your history and an exam. Other tests and procedures may be performed to find the cause of the ulcer. Finding a cause will help determine the best treatment. Tests and procedures may include:  Blood tests, stool tests, or breath tests to check for the bacterium H. pylori.  An upper gastrointestinal (GI) series of the esophagus, stomach, and small intestine.  An endoscopy to examine the esophagus, stomach, and small intestine.  A biopsy. TREATMENT  Treatment may include:  Eliminating the cause of the ulcer, such as smoking, NSAIDs, or alcohol.  Medicines to reduce the amount of acid in your digestive tract.  Antibiotic medicines if the ulcer is caused by the H. pylori bacterium.  An upper endoscopy to treat a bleeding ulcer.  Surgery if  the bleeding is severe or if the ulcer created a hole somewhere in the digestive system. HOME CARE INSTRUCTIONS   Avoid tobacco, alcohol, and caffeine. Smoking can increase the acid in the stomach, and continued smoking will impair the healing of ulcers.  Avoid foods and drinks that seem to cause discomfort or aggravate your ulcer.  Only take medicines as directed by your caregiver. Do not substitute over-the-counter medicines for prescription medicines without talking to your caregiver.  Keep any follow-up appointments and tests as directed. SEEK MEDICAL CARE IF:   Your do not improve within 7 days of starting treatment.  You have ongoing indigestion or heartburn. SEEK IMMEDIATE MEDICAL CARE IF:   You have sudden, sharp, or persistent abdominal pain.  You have bloody or dark black, tarry stools.  You vomit blood or vomit that looks like coffee grounds.  You become light-headed, weak, or feel faint.  You become sweaty or clammy. MAKE SURE YOU:   Understand these instructions.  Will watch your condition.  Will get help right away if you are not doing well or get worse. Document Released: 07/20/2000 Document Revised: 12/07/2013 Document Reviewed: 02/20/2012 [facility] Patient Information 2015 [employer], [place]. This information is not intended to replace advice given to you by your health care provider. Make sure you discuss any questions you have with your health care provider.

## 2014-11-30 NOTE — ED Notes (Signed)
EKG performed due to patient complaining of some chest pain and numbness and tingling to left hand. Patient states has had numbness and tingling to left hand since prior MI. Patient stated, "I haven't felt this bad since I had an MI. Dr. Preston FleetingGlick notified of patient's symptoms.

## 2014-12-05 NOTE — Consult Note (Signed)
PATIENT NAME:  Brett Harper, Brett Harper MR#:  [id number] DATE OF BIRTH:  02-11-1971  DATE OF CONSULTATION:  08/11/2014  REFERRING PHYSICIAN:   CONSULTING PHYSICIAN:  Gonzella Lex, MD  IDENTIFYING INFORMATION AND REASON FOR CONSULTATION: A 44 year old man without identified past psychiatric history who comes into the hospital with chief complaint of substance abuse and depressed mood.   HISTORY OF PRESENT ILLNESS: The patient interviewed, chart reviewed.  The patient's  chief complaint "This pain pill use." The patient states that for several years now he has been intermittently abusing pain medicine. It was started as a treatment for chronic back pain. He has tried to kick him habit and has been able to stay off several times, but since about Thanksgiving this year, he has been back to regular use. He describes his use as 5 or 6 oxycodone only about 3 days a week, not even daily. Denies that he is abusing any other drugs or drinking. Since Thanksgiving his mood has been down and depressed. He says ever since his father died he finds the holidays very depressing. On top of that he lost his job in December. He denies that he is having any suicidal ideation. He had made some vaguely suicidal statements when he came in last night, but now says that he has no intent or plan to harm himself. Denies any psychotic symptoms. Sleep is somewhat impaired. Energy level low. Still has an enjoyment and appreciation of his family. Not getting any outpatient psychiatric treatment.   PAST PSYCHIATRIC HISTORY: No history of psychiatric hospitalization. No history of suicide attempts. Apparently never been on any psychiatric medicine in the past. Has never seen a psychiatrist.   FAMILY HISTORY: Denies any family history of mental illness or substance abuse.   SOCIAL HISTORY: Lives with his wife and 21 year old daughter. 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 Both he and his wife are not  working right now.   PAST MEDICAL HISTORY: Has high blood pressure and has a history of an MI. No other ongoing medical problems.   SUBSTANCE ABUSE HISTORY: Denies abuse of other drugs but has been abusing pain medicine as noted above. Has not had any now since Saturday.   REVIEW OF SYSTEMS: Mood is still mildly depressed. Energy level low. No hallucinations. No suicidal ideation. Physically he is feeling tired, but does not report any other specific physical complaints right now.   MENTAL STATUS EXAMINATION: This is a somewhat disheveled gentleman who looks his stated age. Cooperative with the interview. Eye contact good. Psychomotor activity normal. Speech normal rate, tone and volume. Affect mildly dysphoric but reactive. Mood stated as okay. Thoughts are lucid. No loosening of associations or delusions. Denies auditory or visual hallucinations. Denies suicidal or homicidal ideation. Can recall 3/3 objects immediately and 3/3 at 3 minutes. Alert and oriented x4. Judgment and insight appear to be adequate.   VITAL SIGNS: Blood pressure currently 186/109, respirations 18, pulse 88, temperature 98.   LABORATORY: Lipase normal. Salicylates, acetaminophen and alcohol all negative. Chemistry panel: Low albumin 3.3. Elevated alkaline phosphatase 1350; otherwise unremarkable. CBC unremarkable. Urinalysis: Normal drug screen, negative.   CURRENT MEDICATIONS: He says he takes medicine for blood pressure, but he cannot tell me what it is.   ALLERGIES: NAPROSYN.   ASSESSMENT: A 44 year old man with abuse of opiates and depressed mood, no current suicidal ideation. Some possible abuse of benzodiazepines and alcohol, but has not been using any of those in days either.  Right now he is lucid and appears to be stable. Does not require hospital level treatment.   TREATMENT PLAN: Psychoeducation and review of appropriate treatment done with the patient. No initiation of medicine. The patient encouraged to  discard all narcotics he has at home and has met with liaison from [facility] and will be referred for outpatient evaluation and treatment and substance abuse treatment in the community. He agrees to the plan.   DIAGNOSIS, PRINCIPAL AND PRIMARY:  AXIS I:  1.  Opiate abuse, moderate.  2.  Depression, not otherwise specified.  3.  Rule out alcohol abuse.  AXIS II: Deferred.  AXIS III: High blood pressure. ____________________________ Gonzella Lex, MD jtc:dw D: 08/11/2014 18:17:34 ET T: 08/11/2014 19:41:33 ET JOB#: [id number]  cc: Gonzella Lex, MD, <Dictator> Gonzella Lex MD ELECTRONICALLY SIGNED 09/10/2014 9:55

## 2014-12-14 MED FILL — Oxycodone w/ Acetaminophen Tab 5-325 MG: ORAL | Qty: 6 | Status: AC

## 2014-12-14 MED FILL — Ondansetron HCl Tab 4 MG: ORAL | Qty: 4 | Status: AC

## 2015-04-08 ENCOUNTER — Telehealth: Payer: Self-pay

## 2015-04-08 ENCOUNTER — Other Ambulatory Visit: Payer: Self-pay

## 2015-04-08 ENCOUNTER — Other Ambulatory Visit: Payer: Self-pay | Admitting: Physician Assistant

## 2015-04-08 MED ORDER — METOPROLOL TARTRATE 25 MG PO TABS: 12.5000 mg | ORAL_TABLET | Freq: Two times a day (BID) | ORAL | Status: DC

## 2015-04-08 MED ORDER — METOPROLOL SUCCINATE ER 25 MG PO TB24: 12.5000 mg | ORAL_TABLET | Freq: Every day | ORAL | Status: DC

## 2015-04-08 NOTE — Telephone Encounter (Signed)
Requested Toprol refill for pt,cannot verify dose,we have pt taking 12.5 mg daily,request is for 25 mg daily.Pt No -Showed last apt and has not rescheduled.I spoke with [facility] pharmacist Katie in [place] in an attempt to clarify.My records indicate dose decreased to 12.5 mg on 02/23/14. I will give pt 15 day supply and have him make f/u apt.Phatmacist states rhonda barrett was the last provider to write 2 week rx last year.

## 2015-05-14 ENCOUNTER — Other Ambulatory Visit: Payer: Self-pay | Admitting: Adult Health

## 2015-09-29 ENCOUNTER — Emergency Department (HOSPITAL_COMMUNITY): Payer: Self-pay

## 2015-09-29 ENCOUNTER — Encounter (HOSPITAL_COMMUNITY): Payer: Self-pay | Admitting: Emergency Medicine

## 2015-09-29 ENCOUNTER — Emergency Department (HOSPITAL_COMMUNITY)
Admission: EM | Admit: 2015-09-29 | Discharge: 2015-09-29 | Disposition: A | Discharge status: Home / Self Care | Payer: Self-pay | Attending: Emergency Medicine | Admitting: Emergency Medicine

## 2015-09-29 DIAGNOSIS — M109 Gout, unspecified: Secondary | ICD-10-CM | POA: Insufficient documentation

## 2015-09-29 DIAGNOSIS — K029 Dental caries, unspecified: Secondary | ICD-10-CM | POA: Insufficient documentation

## 2015-09-29 DIAGNOSIS — Z7982 Long term (current) use of aspirin: Secondary | ICD-10-CM | POA: Insufficient documentation

## 2015-09-29 DIAGNOSIS — I252 Old myocardial infarction: Secondary | ICD-10-CM | POA: Insufficient documentation

## 2015-09-29 DIAGNOSIS — I10 Essential (primary) hypertension: Secondary | ICD-10-CM | POA: Insufficient documentation

## 2015-09-29 DIAGNOSIS — Z79899 Other long term (current) drug therapy: Secondary | ICD-10-CM | POA: Insufficient documentation

## 2015-09-29 DIAGNOSIS — Z791 Long term (current) use of non-steroidal anti-inflammatories (NSAID): Secondary | ICD-10-CM | POA: Insufficient documentation

## 2015-09-29 DIAGNOSIS — E669 Obesity, unspecified: Secondary | ICD-10-CM | POA: Insufficient documentation

## 2015-09-29 DIAGNOSIS — Z7902 Long term (current) use of antithrombotics/antiplatelets: Secondary | ICD-10-CM | POA: Insufficient documentation

## 2015-09-29 DIAGNOSIS — J209 Acute bronchitis, unspecified: Secondary | ICD-10-CM | POA: Insufficient documentation

## 2015-09-29 DIAGNOSIS — K047 Periapical abscess without sinus: Secondary | ICD-10-CM

## 2015-09-29 DIAGNOSIS — Z9889 Other specified postprocedural states: Secondary | ICD-10-CM | POA: Insufficient documentation

## 2015-09-29 DIAGNOSIS — Z862 Personal history of diseases of the blood and blood-forming organs and certain disorders involving the immune mechanism: Secondary | ICD-10-CM | POA: Insufficient documentation

## 2015-09-29 MED ORDER — PROMETHAZINE-DM 6.25-15 MG/5ML PO SYRP: 5.0000 mL | ORAL_SOLUTION | Freq: Four times a day (QID) | ORAL | Status: DC | PRN

## 2015-09-29 MED ORDER — CLINDAMYCIN HCL 150 MG PO CAPS: ORAL_CAPSULE | ORAL | Status: DC

## 2015-09-29 MED ORDER — ALBUTEROL SULFATE HFA 108 (90 BASE) MCG/ACT IN AERS
2.0000 | INHALATION_SPRAY | Freq: Once | RESPIRATORY_TRACT | Status: AC
  Administered 2015-09-29: 2 via RESPIRATORY_TRACT
  Filled 2015-09-29: qty 6.7

## 2015-09-29 MED ORDER — DEXAMETHASONE SODIUM PHOSPHATE 4 MG/ML IJ SOLN
8.0000 mg | Freq: Once | INTRAMUSCULAR | Status: AC
  Administered 2015-09-29: 8 mg via INTRAMUSCULAR
  Filled 2015-09-29: qty 2

## 2015-09-29 MED ORDER — CLINDAMYCIN HCL 150 MG PO CAPS
300.0000 mg | ORAL_CAPSULE | Freq: Once | ORAL | Status: AC
  Administered 2015-09-29: 300 mg via ORAL
  Filled 2015-09-29: qty 2

## 2015-09-29 MED ORDER — ALBUTEROL SULFATE (2.5 MG/3ML) 0.083% IN NEBU
2.5000 mg | INHALATION_SOLUTION | Freq: Once | RESPIRATORY_TRACT | Status: AC
  Administered 2015-09-29: 2.5 mg via RESPIRATORY_TRACT
  Filled 2015-09-29: qty 3

## 2015-09-29 MED ORDER — ACETAMINOPHEN-CODEINE #3 300-30 MG PO TABS: 1.0000 | ORAL_TABLET | Freq: Four times a day (QID) | ORAL | Status: DC | PRN

## 2015-09-29 MED ORDER — HYDROCOD POLST-CPM POLST ER 10-8 MG/5ML PO SUER
5.0000 mL | Freq: Once | ORAL | Status: AC
Start: 2015-09-29 — End: 2015-09-29
  Administered 2015-09-29: 5 mL via ORAL
  Filled 2015-09-29: qty 5

## 2015-09-29 NOTE — ED Provider Notes (Signed)
CSN: [id number]     Arrival date & time 09/29/15  1218 History   First MD Initiated Contact with Patient 09/29/15 1314     Chief Complaint  Patient presents with  . Dental Pain  . Cough     (Consider location/radiation/quality/duration/timing/severity/associated sxs/prior Treatment) HPI Comments: Patient is a 45 year old male who presents to the emergency department with a complaint of dental pain and cough.  The patient reports cough over the last 3-4 days. He states that this is getting worse. He complains of the cough being productive with green phlegm. He states he's had some chills, but has not really measured her temperature at home. He has some soreness in his rib area. He's not had any previous operations or procedures involving the chest. He has not required hospitalization for chest related issues. It is of note that the patient is on Plavix.  The patient complains of right upper dental pain. States he has swelling of the gum. He also complains of a sensation of swelling and throbbing extending from the left upper jaw up to the area under the right eye. He has not had any problems with his vision. He does have throbbing at times from the tooth pain. This has not been treated recently.  Patient is a 45 y.o. male presenting with tooth pain and cough. The history is provided by the patient.  Dental Pain Cough   Past Medical History  Diagnosis Date  . Hypertension   . Pulmonary sarcoidosis (HCC)     a. 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 H/O inguinal hernia repair   . Gout   . Obesity   . MI, old    Past Surgical History  Procedure Laterality Date  . Left heart catheterization with coronary angiogram N/A 02/03/2014    Procedure: LEFT HEART CATHETERIZATION WITH CORONARY ANGIOGRAM;  Surgeon: Iran Ouch, MD;  Location: [facility] CATH LAB;  Service: Cardiovascular;  Laterality: N/A;  . Left heart catheterization with coronary  angiogram N/A 02/04/2014    Procedure: LEFT HEART CATHETERIZATION WITH CORONARY ANGIOGRAM;  Surgeon: Kathleene Hazel, MD;  Location: [facility] CATH LAB;  Service: Cardiovascular;  Laterality: N/A;   Family History  Problem Relation Age of Onset  . Hypertension Mother   . Heart attack Father   . Diabetes Father   . Stroke Father   . Heart attack Maternal Grandfather   . Heart attack Paternal Grandfather   . Heart attack Cousin      stent placed in 64s   Social History  Substance Use Topics  . Smoking status: Never Smoker   . Smokeless tobacco: Never Used  . Alcohol Use: No    Review of Systems  HENT: Positive for dental problem.   Respiratory: Positive for cough.   All other systems reviewed and are negative.     Allergies  Naproxen  Home Medications   Prior to Admission medications   Medication Sig Start Date End Date Taking? Authorizing Provider  aspirin EC 81 MG EC tablet Take 1 tablet (81 mg total) by mouth daily. 02/05/14   Joline Salt Barrett, PA-C  clopidogrel (PLAVIX) 75 MG tablet TAKE ONE TABLET BY MOUTH ONCE DAILY 05/16/15   Jodelle Gross, NP  meloxicam (MOBIC) 15 MG tablet Take 15 mg by mouth daily.    Historical Provider, MD  metoprolol succinate (TOPROL XL) 25 MG 24 hr tablet Take 0.5 tablets (12.5 mg total) by mouth daily. 04/08/15   Christiane Ha  Margy Clarks, MD  nitroGLYCERIN (NITROSTAT) 0.4 MG SL tablet Place 1 tablet (0.4 mg total) under the tongue every 5 (five) minutes as needed for chest pain. 02/05/14   Rhonda G Barrett, PA-C  omeprazole (PRILOSEC OTC) 20 MG tablet Take 20 mg by mouth daily as needed (for aci reflux).    Historical Provider, MD  ondansetron (ZOFRAN ODT) 4 MG disintegrating tablet Take 1 tablet (4 mg total) by mouth every 4 (four) hours as needed for nausea or vomiting. 08/08/14   Arby Barrette, MD  ondansetron (ZOFRAN) 4 MG tablet Take 1 tablet (4 mg total) by mouth every 6 (six) hours as needed for nausea. 11/30/14   Dione Booze, MD   oxyCODONE-acetaminophen (PERCOCET) 5-325 MG per tablet Take 1 tablet by mouth every 4 (four) hours as needed for moderate pain. 11/30/14   Dione Booze, MD  traMADol (ULTRAM) 50 MG tablet Take 100 mg by mouth every 6 (six) hours as needed for moderate pain.  04/05/14   Historical Provider, MD   BP 151/95 mmHg  Pulse 94  Temp(Src) 99.4 F (37.4 C) (Oral)  Resp 18  Ht  (1.803 m)  Wt 145.151 kg  BMI 44.65 kg/m2  SpO2 98% Physical Exam  Constitutional: He is oriented to person, place, and time. He appears well-developed and well-nourished.  Non-toxic appearance.  HENT:  Head: Normocephalic.  Right Ear: Tympanic membrane and external ear normal.  Left Ear: Tympanic membrane and external ear normal.  There is mild right facial swelling present.  There are deep cavities of the right upper molar and premolar. There is swelling of the gum, but no visible abscess. The airway is patent. There is no swelling under the tongue.  Eyes: EOM and lids are normal. Pupils are equal, round, and reactive to light.  Neck: Normal range of motion. Neck supple. Carotid bruit is not present.  Cardiovascular: Normal rate, regular rhythm, normal heart sounds, intact distal pulses and normal pulses.   Pulmonary/Chest: Breath sounds normal. No respiratory distress.  There are coarse breath sounds present, with a few scattered rhonchi present. The right side is more intense than the left. There is symmetrical rise and fall of the chest. The patient speaks in complete sentences. There is active cough during the examination.  Abdominal: Soft. Bowel sounds are normal. There is no tenderness. There is no guarding.  Musculoskeletal: Normal range of motion.  Lymphadenopathy:       Head (right side): No submandibular adenopathy present.       Head (left side): No submandibular adenopathy present.    He has no cervical adenopathy.  Neurological: He is alert and oriented to person, place, and time. He has normal  strength. No cranial nerve deficit or sensory deficit.  Skin: Skin is warm and dry.  Psychiatric: He has a normal mood and affect. His speech is normal.  Nursing note and vitals reviewed.   ED Course  Procedures (including critical care time) Labs Review Labs Reviewed - No data to display  Imaging Review Dg Chest 2 View  09/29/2015  CLINICAL DATA:  Swelling with fevers.  Cough. EXAM: CHEST  2 VIEW COMPARISON:  02/02/2014 and 01/26/2010 FINDINGS: Low lung volumes are similar to the prior examination. Prominent lung markings are also unchanged. No focal airspace disease. Heart and mediastinum are stable and within normal limits. The trachea is midline. No pleural effusions. Mild degenerative changes in the thoracic spine. IMPRESSION: Coarse lung markings appear to be chronic.  No acute chest findings. Electronically  Signed   By: Richarda Overlie M.D.   On: 09/29/2015 14:03   I have personally reviewed and evaluated these images and lab results as part of my medical decision-making.   EKG Interpretation None      MDM  Vital signs reviewed. Also oximetry is 95-98% on room air.  The patient has a deep dental caries present on the right upper gum area. I have discussed these with the patient in terms which he understands. The treatment at this time will be clindamycin and Tylenol codeine.  The patient x-ray of the chest is negative for pneumonia. There are some mild degenerative changes of the thoracic spine noted. There are coarse lung markings that appear to be mostly chronic, and findings consistent with a bronchitis. The patient was given Decadron intramuscularly in the emergency department. He will be treated with promethazine DM, albuterol inhaler. Patient to see his primary physician, or return to the department if any changes, problems, or concerns.    Final diagnoses:  None    *I have reviewed nursing notes, vital signs, and all appropriate lab and imaging results for this  patient.[address] PA-C 09/29/15 1446  Eber Hong, MD 09/29/15 [phone]

## 2015-09-29 NOTE — ED Notes (Signed)
Pt reports abscessed tooth to upper right teeth since Tuesday.  Pt has swelling,fevers, denies drainage.

## 2015-09-29 NOTE — Discharge Instructions (Signed)
Your oxygen level is 98% on room air. Your exam and chest x-ray are consistent with bronchitis. You have very deep cavities of teeth on the right upper jaw. It is important that she see a dentist as soon as possible to prevent spread of further infection. Please use clindamycin 2 times daily with food until all taken. Use Tylenol codeine for pain if needed. This medication may cause drowsiness. Please use 2 puffs of albuterol every 4 hours for cough and assistance with breathing. Use promethazine DM every 6 hours for cough. Please see Dr. Neita Carp for recheck next week. Dental Caries Dental caries (also called tooth decay) is the most common oral disease. It can occur at any age but is more common in children and young adults.  HOW DENTAL CARIES DEVELOPS  The process of decay begins when bacteria and foods (particularly sugars and starches) combine in your mouth to produce plaque. Plaque is a substance that sticks to the hard, outer surface of a tooth (enamel). The bacteria in plaque produce acids that attack enamel. These acids may also attack the root surface of a tooth (cementum) if it is exposed. Repeated attacks dissolve these surfaces and create holes in the tooth (cavities). If left untreated, the acids destroy the other layers of the tooth.  RISK FACTORS  Frequent sipping of sugary beverages.   Frequent snacking on sugary and starchy foods, especially those that easily get stuck in the teeth.   Poor oral hygiene.   Dry mouth.   Substance abuse such as methamphetamine abuse.   Broken or poor-fitting dental restorations.   Eating disorders.   Gastroesophageal reflux disease (GERD).   Certain radiation treatments to the head and neck. SYMPTOMS In the early stages of dental caries, symptoms are seldom present. Sometimes white, chalky areas may be seen on the enamel or other tooth layers. In later stages, symptoms may include:  Pits and holes on the enamel.  Toothache after  sweet, hot, or cold foods or drinks are consumed.  Pain around the tooth.  Swelling around the tooth. DIAGNOSIS  Most of the time, dental caries is detected during a regular dental checkup. A diagnosis is made after a thorough medical and dental history is taken and the surfaces of your teeth are checked for signs of dental caries. Sometimes special instruments, such as lasers, are used to check for dental caries. Dental X-ray exams may be taken so that areas not visible to the eye (such as between the contact areas of the teeth) can be checked for cavities.  TREATMENT  If dental caries is in its early stages, it may be reversed with a fluoride treatment or an application of a remineralizing agent at the dental office. Thorough brushing and flossing at home is needed to aid these treatments. If it is in its later stages, treatment depends on the location and extent of tooth destruction:   If a small area of the tooth has been destroyed, the destroyed area will be removed and cavities will be filled with a material such as gold, silver amalgam, or composite resin.   If a large area of the tooth has been destroyed, the destroyed area will be removed and a cap (crown) will be fitted over the remaining tooth structure.   If the center part of the tooth (pulp) is affected, a procedure called a root canal will be needed before a filling or crown can be placed.   If most of the tooth has been destroyed, the tooth may  need to be pulled (extracted). HOME CARE INSTRUCTIONS You can prevent, stop, or reverse dental caries at home by practicing good oral hygiene. Good oral hygiene includes:  Thoroughly cleaning your teeth at least twice a day with a toothbrush and dental floss.   Using a fluoride toothpaste. A fluoride mouth rinse may also be used if recommended by your dentist or health care provider.   Restricting the amount of sugary and starchy foods and sugary liquids you consume.   Avoiding  frequent snacking on these foods and sipping of these liquids.   Keeping regular visits with a dentist for checkups and cleanings. PREVENTION   Practice good oral hygiene.  Consider a dental sealant. A dental sealant is a coating material that is applied by your dentist to the pits and grooves of teeth. The sealant prevents food from being trapped in them. It may protect the teeth for several years.  Ask about fluoride supplements if you live in a community without fluorinated water or with water that has a low fluoride content. Use fluoride supplements as directed by your dentist or health care provider.  Allow fluoride varnish applications to teeth if directed by your dentist or health care provider.   This information is not intended to replace advice given to you by your health care provider. Make sure you discuss any questions you have with your health care provider.   Document Released: 04/14/2002 Document Revised: 08/13/2014 Document Reviewed: 07/25/2012 Elsevier Interactive Patient Education 2016 [employer]  Acute Bronchitis Bronchitis is when the airways that extend from the windpipe into the lungs get red, puffy, and painful (inflamed). Bronchitis often causes thick spit (mucus) to develop. This leads to a cough. A cough is the most common symptom of bronchitis. In acute bronchitis, the condition usually begins suddenly and goes away over time (usually in 2 weeks). Smoking, allergies, and asthma can make bronchitis worse. Repeated episodes of bronchitis may cause more lung problems. HOME CARE  Rest.  Drink enough fluids to keep your pee (urine) clear or pale yellow (unless you need to limit fluids as told by your doctor).  Only take over-the-counter or prescription medicines as told by your doctor.  Avoid smoking and secondhand smoke. These can make bronchitis worse. If you are a smoker, think about using nicotine gum or skin patches. Quitting smoking will help your lungs  heal faster.  Reduce the chance of getting bronchitis again by:  Washing your hands often.  Avoiding people with cold symptoms.  Trying not to touch your hands to your mouth, nose, or eyes.  Follow up with your doctor as told. GET HELP IF: Your symptoms do not improve after 1 week of treatment. Symptoms include:  Cough.  Fever.  Coughing up thick spit.  Body aches.  Chest congestion.  Chills.  Shortness of breath.  Sore throat. GET HELP RIGHT AWAY IF:   You have an increased fever.  You have chills.  You have severe shortness of breath.  You have bloody thick spit (sputum).  You throw up (vomit) often.  You lose too much body fluid (dehydration).  You have a severe headache.  You faint. MAKE SURE YOU:   Understand these instructions.  Will watch your condition.  Will get help right away if you are not doing well or get worse.   This information is not intended to replace advice given to you by your health care provider. Make sure you discuss any questions you have with your health care provider.  Document Released: 01/09/2008 Document Revised: 03/25/2013 Document Reviewed: 01/13/2013 Elsevier Interactive Patient Education [employer]

## 2015-09-29 NOTE — ED Notes (Signed)
PA at bedside.

## 2016-10-24 ENCOUNTER — Emergency Department (HOSPITAL_COMMUNITY): Payer: Commercial Managed Care - HMO

## 2016-10-24 ENCOUNTER — Encounter (HOSPITAL_COMMUNITY): Payer: Self-pay | Admitting: Cardiology

## 2016-10-24 ENCOUNTER — Emergency Department (HOSPITAL_COMMUNITY)
Admission: EM | Admit: 2016-10-24 | Discharge: 2016-10-24 | Disposition: A | Discharge status: Home / Self Care | Payer: Commercial Managed Care - HMO | Attending: Emergency Medicine | Admitting: Emergency Medicine

## 2016-10-24 DIAGNOSIS — R079 Chest pain, unspecified: Secondary | ICD-10-CM | POA: Diagnosis not present

## 2016-10-24 DIAGNOSIS — I159 Secondary hypertension, unspecified: Secondary | ICD-10-CM | POA: Diagnosis not present

## 2016-10-24 DIAGNOSIS — I2510 Atherosclerotic heart disease of native coronary artery without angina pectoris: Secondary | ICD-10-CM | POA: Diagnosis not present

## 2016-10-24 DIAGNOSIS — I10 Essential (primary) hypertension: Secondary | ICD-10-CM | POA: Diagnosis not present

## 2016-10-24 DIAGNOSIS — I252 Old myocardial infarction: Secondary | ICD-10-CM | POA: Diagnosis not present

## 2016-10-24 DIAGNOSIS — R0602 Shortness of breath: Secondary | ICD-10-CM | POA: Diagnosis present

## 2016-10-24 DIAGNOSIS — Z79899 Other long term (current) drug therapy: Secondary | ICD-10-CM | POA: Insufficient documentation

## 2016-10-24 DIAGNOSIS — Z7982 Long term (current) use of aspirin: Secondary | ICD-10-CM | POA: Diagnosis not present

## 2016-10-24 LAB — BASIC METABOLIC PANEL
ANION GAP: 9 (ref 5–15)
BUN: 12 mg/dL (ref 6–20)
CHLORIDE: 101 mmol/L (ref 101–111)
CO2: 26 mmol/L (ref 22–32)
CREATININE: 0.98 mg/dL (ref 0.61–1.24)
Calcium: 9.9 mg/dL (ref 8.9–10.3)
GFR calc non Af Amer: >60 mL/min (ref >60)
Glucose, Bld: 104 mg/dL — ABNORMAL HIGH (ref 65–99)
Potassium: 4.1 mmol/L (ref 3.5–5.1)
Sodium: 136 mmol/L (ref 135–145)

## 2016-10-24 LAB — CBC
HCT: 48.4 % (ref 39.0–52.0)
Hemoglobin: 16.7 g/dL (ref 13.0–17.0)
MCH: 29.6 pg (ref 26.0–34.0)
MCHC: 34.5 g/dL (ref 30.0–36.0)
MCV: 85.8 fL (ref 78.0–100.0)
Platelets: 238 10*3/uL (ref 150–400)
RBC: 5.64 MIL/uL (ref 4.22–5.81)
RDW: 12.5 % (ref 11.5–15.5)
WBC: 10.4 10*3/uL (ref 4.0–10.5)

## 2016-10-24 LAB — D-DIMER, QUANTITATIVE: D-Dimer, Quant: 0.55 ug/mL-FEU — ABNORMAL HIGH (ref 0.00–0.50)

## 2016-10-24 LAB — I-STAT TROPONIN, ED
Troponin i, poc: 0 ng/mL (ref 0.00–0.08)
Troponin i, poc: 0 ng/mL (ref 0.00–0.08)

## 2016-10-24 MED ORDER — LORAZEPAM 2 MG/ML IJ SOLN
1.0000 mg | Freq: Once | INTRAMUSCULAR | Status: AC
  Administered 2016-10-24: 1 mg via INTRAVENOUS
  Filled 2016-10-24: qty 1

## 2016-10-24 MED ORDER — IOPAMIDOL (ISOVUE-370) INJECTION 76%
100.0000 mL | Freq: Once | INTRAVENOUS | Status: AC | PRN
  Administered 2016-10-24: 100 mL via INTRAVENOUS

## 2016-10-24 MED ORDER — KETOROLAC TROMETHAMINE 30 MG/ML IJ SOLN
30.0000 mg | Freq: Once | INTRAMUSCULAR | Status: AC
  Administered 2016-10-24: 30 mg via INTRAVENOUS
  Filled 2016-10-24: qty 1

## 2016-10-24 NOTE — ED Provider Notes (Signed)
[facility]Y DEPT Provider Note   CSN: [id number] Arrival date & time: 10/24/16  1012   By signing my name below, I, Bobbie Stack, attest that this documentation has been prepared under the direction and in the presence of Mancel Bale, MD. Electronically Signed: Bobbie Stack, Scribe. 10/24/16. 11:23 AM. History   Chief Complaint Chief Complaint  Patient presents with  . Chest Pain     The history is provided by the patient. No language interpreter was used.  HPI Comments: Brett Harper is a 46 y.o. male who presents to the Emergency Department complaining of constant worsening left sided CP that began around 4 am this morning. He describes the pain as a "pressure" sensation. He rates the pain 8/10. The patient states that the pain radiates to his left arm. He states that the pain is similar to when he had an MI about 3 years ago. The patient states that he woke up around 3 am this morning and felt nauseated. About an hour after he woke up the CP and SOB began. The patient takes baby ASA daily. He takes meloxicam for gout. He denies smoking currently. He states that he hasn't ate anything for the past 2 days.  Past Medical History:  Diagnosis Date  . Gout   . H/O inguinal hernia repair   . Hypertension   . MI, old   . Obesity   . Pulmonary sarcoidosis (HCC)    a. 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 Rash 04/09/2014  . Coronary atherosclerosis of native coronary artery 02/05/2014  . Dyslipidemia 02/04/2014  . Family history of coronary artery disease in father 02/04/2014  . Anxiety 02/04/2014  . NSTEMI (non-ST elevated myocardial infarction) (HCC) 02/03/2014  . Chest pain 02/02/2014  . Hypertension   . Pulmonary sarcoidosis (HCC)   . H/O inguinal hernia repair   . Gout   . Obesity- BMI 47     Past Surgical History:  Procedure Laterality Date  . LEFT  HEART CATHETERIZATION WITH CORONARY ANGIOGRAM N/A 02/03/2014   Procedure: LEFT HEART CATHETERIZATION WITH CORONARY ANGIOGRAM;  Surgeon: Iran Ouch, MD;  Location: [facility] CATH LAB;  Service: Cardiovascular;  Laterality: N/A;  . LEFT HEART CATHETERIZATION WITH CORONARY ANGIOGRAM N/A 02/04/2014   Procedure: LEFT HEART CATHETERIZATION WITH CORONARY ANGIOGRAM;  Surgeon: Kathleene Hazel, MD;  Location: [facility] CATH LAB;  Service: Cardiovascular;  Laterality: N/A;       Home Medications    Prior to Admission medications   Medication Sig Start Date End Date Taking? Authorizing Provider  aspirin EC 81 MG EC tablet Take 1 tablet (81 mg total) by mouth daily. 02/05/14  Yes Rhonda G Barrett, PA-C  meloxicam (MOBIC) 15 MG tablet Take 15 mg by mouth daily.   Yes Historical Provider, MD  nitroGLYCERIN (NITROSTAT) 0.4 MG SL tablet Place 1 tablet (0.4 mg total) under the tongue every 5 (five) minutes as needed for chest pain. 02/05/14  Yes Rhonda G Barrett, PA-C  omeprazole (PRILOSEC OTC) 20 MG tablet Take 20 mg by mouth daily as needed (for aci reflux).   Yes Historical Provider, MD  acetaminophen-codeine (TYLENOL #3) 300-30 MG tablet Take 1-2 tablets by mouth every 6 (six) hours as needed. Patient not taking: Reported on 10/24/2016 09/29/15   Ivery Quale, PA-C  clindamycin (CLEOCIN) 150 MG capsule 2 po bid with food Patient not taking: Reported on 10/24/2016 09/29/15  Ivery QualeHobson Bryant, PA-C  clopidogrel (PLAVIX) 75 MG tablet TAKE ONE TABLET BY MOUTH ONCE DAILY Patient not taking: Reported on 10/24/2016 05/16/15   Jodelle GrossKathryn M Lawrence, NP  metoprolol succinate (TOPROL XL) 25 MG 24 hr tablet Take 0.5 tablets (12.5 mg total) by mouth daily. Patient not taking: Reported on 10/24/2016 04/08/15   Antoine PocheJonathan F Branch, MD  ondansetron (ZOFRAN ODT) 4 MG disintegrating tablet Take 1 tablet (4 mg total) by mouth every 4 (four) hours as needed for nausea or vomiting. Patient not taking: Reported on 10/24/2016 08/08/14   Arby BarretteMarcy Pfeiffer,  MD  ondansetron (ZOFRAN) 4 MG tablet Take 1 tablet (4 mg total) by mouth every 6 (six) hours as needed for nausea. Patient not taking: Reported on 10/24/2016 11/30/14   Dione Boozeavid Glick, MD  oxyCODONE-acetaminophen (PERCOCET) 5-325 MG per tablet Take 1 tablet by mouth every 4 (four) hours as needed for moderate pain. Patient not taking: Reported on 10/24/2016 11/30/14   Dione Boozeavid Glick, MD  promethazine-dextromethorphan (PROMETHAZINE-DM) 6.25-15 MG/5ML syrup Take 5 mLs by mouth 4 (four) times daily as needed for cough. 10ml po q6h prn cough Patient not taking: Reported on 10/24/2016 09/29/15   Ivery QualeHobson Bryant, PA-C    Family History Family History  Problem Relation Age of Onset  . Hypertension Mother   . Heart attack Father   . Diabetes Father   . Stroke Father   . Heart attack Maternal Grandfather   . Heart attack Paternal Grandfather   . Heart attack Cousin      stent placed in 5840s    Social History Social History  Substance Use Topics  . Smoking status: Never Smoker  . Smokeless tobacco: Never Used  . Alcohol use No     Allergies   Naproxen   Review of Systems Review of Systems  Constitutional: Negative for fever.  Respiratory: Positive for shortness of breath. Negative for cough.   Cardiovascular: Positive for chest pain (Left Sided).  Gastrointestinal: Positive for nausea. Negative for vomiting.  Neurological: Negative for dizziness.  All other systems reviewed and are negative.    Physical Exam Updated Vital Signs BP (!) 172/122   Pulse 93   Resp (!) 25   Ht 5\' 11"  (1.803 m)   Wt (!) 310 lb (140.6 kg)   SpO2 99%   BMI 43.24 kg/m   Physical Exam  Constitutional: He is oriented to person, place, and time. He appears well-developed and well-nourished.  HENT:  Head: Normocephalic and atraumatic.  Right Ear: External ear normal.  Left Ear: External ear normal.  Eyes: Conjunctivae and EOM are normal. Pupils are equal, round, and reactive to light.  Neck: Normal range of  motion and phonation normal. Neck supple.  Cardiovascular: Normal rate, regular rhythm and normal heart sounds.   Pulmonary/Chest: Effort normal and breath sounds normal. He exhibits no bony tenderness.  Abdominal: Soft. There is no tenderness.  Musculoskeletal: Normal range of motion. He exhibits edema.  1+  LE edema. Left calf tenderness. No assymetry of lower legs.  Neurological: He is alert and oriented to person, place, and time. No cranial nerve deficit or sensory deficit. He exhibits normal muscle tone. Coordination normal.  Skin: Skin is warm, dry and intact.  Psychiatric: He has a normal mood and affect. His behavior is normal. Judgment and thought content normal.  Nursing note and vitals reviewed.    ED Treatments / Results  DIAGNOSTIC STUDIES: Oxygen Saturation is 100% on RA, normal by my interpretation.    COORDINATION OF CARE:  10:55 AM Discussed treatment plan with pt at bedside and pt agreed to plan. I will check the patient's CXR, EKG, and labs.  Labs (all labs ordered are listed, but only abnormal results are displayed) Labs Reviewed  BASIC METABOLIC PANEL - Abnormal; Notable for the following:       Result Value   Glucose, Bld 104 (*)    All other components within normal limits  CBC  D-DIMER, QUANTITATIVE (NOT AT [facility])  Rosezena Sensor, ED    EKG  EKG Interpretation  Date/Time:  Wednesday October 24 2016 10:20:21 EDT Ventricular Rate:  110 PR Interval:    QRS Duration: 88 QT Interval:  307 QTC Calculation: 418 R Axis:   24 Text Interpretation:  Sinus tachycardia Anteroseptal infarct, old st depression anterior Baseline wander in lead(s) II III aVR aVF Since last tracing rate faster and ST depression is new Confirmed by Effie Shy  MD, Domnick Chervenak [phone]) on 10/24/2016 10:52:13 AM       Radiology Dg Chest 2 View  Result Date: 10/24/2016 CLINICAL DATA:  Chest pain. EXAM: CHEST  2 VIEW COMPARISON:  09/29/2015. FINDINGS: Cardiomegaly. Low lung volumes with basilar  atelectasis. No focal infiltrate. No pleural effusion or pneumothorax. No acute bony abnormality. IMPRESSION: 1.  Cardiomegaly.  No pulmonary venous congestion. 2. Low lung volumes with mild basilar atelectasis . Electronically Signed   By: Maisie Fus  Register   On: 10/24/2016 11:00    Procedures Procedures (including critical care time)  Medications Ordered in ED Medications - No data to display   Initial Impression / Assessment and Plan / ED Course  I have reviewed the triage vital signs and the nursing notes.  Pertinent labs & imaging results that were available during my care of the patient were reviewed by me and considered in my medical decision making (see chart for details).    Medications  ketorolac (TORADOL) 30 MG/ML injection 30 mg (30 mg Intravenous Given 10/24/16 1435)  LORazepam (ATIVAN) injection 1 mg (1 mg Intravenous Given 10/24/16 1435)  iopamidol (ISOVUE-370) 76 % injection 100 mL (100 mLs Intravenous Contrast Given 10/24/16 1455)    Patient Vitals for the past 24 hrs:  BP Pulse Resp SpO2 Height Weight  10/24/16 1500 (!) 186/110 92 20 94 % - -  10/24/16 1439 (!) 188/119 76 (!) 22 96 % - -  10/24/16 1230 (!) 179/92 78 18 98 % - -  10/24/16 1200 (!) 150/99 85 11 96 % - -  10/24/16 1130 (!) 171/111 86 (!) 22 96 % - -  10/24/16 1121 (!) 157/108 84 18 100 % - -  10/24/16 1100 (!) 172/122 93 (!) 25 99 % - -  10/24/16 1030 (!) 169/106 94 (!) 29 100 % - -  10/24/16 1024 (!) 162/96 (!) 103 (!) 26 100 % - -  10/24/16 1019 - - - - 5\' 11"  (1.803 m) (!) 310 lb (140.6 kg)    3:57 PM Reevaluation with update and discussion. After initial assessment and treatment, an updated evaluation reveals patient is sitting up and appears more comfortable.  Findings discussed with the patient and all questions were answered. Locklan Canoy L    Final Clinical Impressions(s) / ED Diagnoses   Final diagnoses:  Nonspecific chest pain  Secondary hypertension    Nonspecific chest pain, with  negative ED evaluation, for myocardial infarction.  Troponin negative.  Evaluation of shortness of breath, with CT imaging ruled out.  The patient is noncompliant with treatment previously prescribed by his PCP and  cardiologist.  Blood pressure elevated without signs and symptoms of endorgan damage.  Patient with elevated blood pressure, previously, in 2017, at that time untreated.  He apparently has had prolonged noncompliance with treatment and follow-up.  Symptoms are chronic in nature today.  Nursing Notes Reviewed/ Care Coordinated Applicable Imaging Reviewed Interpretation of Laboratory Data incorporated into ED treatment  The patient appears reasonably screened and/or stabilized for discharge and I doubt any other medical condition or other [facility] requiring further screening, evaluation, or treatment in the ED at this time prior to discharge.  Plan: Home Medications- OTC prn; Home Treatments- Decrease salt in diet; return here if the recommended treatment, does not improve the symptoms; Recommended follow up- PCP for checkup asap    New Prescriptions New Prescriptions   No medications on file   I personally performed the services described in this documentation, which was scribed in my presence. The recorded information has been reviewed and is accurate.    Mancel Bale, MD 10/24/16 [phone]

## 2016-10-24 NOTE — ED Triage Notes (Signed)
Chest pain radiating down left arm since 4 am. c/o sob

## 2016-10-24 NOTE — Discharge Instructions (Signed)
Call your Dr. today for follow-up appointment as soon as possible for evaluation and treatment of your high blood pressure.  Try to avoid salt in your diet.  Return here, if needed, for problems.

## 2016-10-25 DIAGNOSIS — M940 Chondrocostal junction syndrome [Tietze]: Secondary | ICD-10-CM | POA: Diagnosis not present

## 2016-10-25 DIAGNOSIS — Z1389 Encounter for screening for other disorder: Secondary | ICD-10-CM | POA: Diagnosis not present

## 2016-10-25 DIAGNOSIS — I10 Essential (primary) hypertension: Secondary | ICD-10-CM | POA: Diagnosis not present

## 2016-10-25 DIAGNOSIS — I712 Thoracic aortic aneurysm, without rupture: Secondary | ICD-10-CM | POA: Diagnosis not present

## 2016-11-06 ENCOUNTER — Encounter: Payer: Self-pay | Admitting: Thoracic Surgery (Cardiothoracic Vascular Surgery)

## 2016-11-06 ENCOUNTER — Institutional Professional Consult (permissible substitution) (INDEPENDENT_AMBULATORY_CARE_PROVIDER_SITE_OTHER): Payer: Commercial Managed Care - HMO | Admitting: Thoracic Surgery (Cardiothoracic Vascular Surgery)

## 2016-11-06 VITALS — BP 163/95 | HR 61 | Resp 16 | Ht 71.0 in | Wt 320.0 lb

## 2016-11-06 DIAGNOSIS — I2510 Atherosclerotic heart disease of native coronary artery without angina pectoris: Secondary | ICD-10-CM | POA: Diagnosis not present

## 2016-11-06 DIAGNOSIS — I712 Thoracic aortic aneurysm, without rupture, unspecified: Secondary | ICD-10-CM

## 2016-11-06 DIAGNOSIS — Z955 Presence of coronary angioplasty implant and graft: Secondary | ICD-10-CM | POA: Diagnosis not present

## 2016-11-06 DIAGNOSIS — I7121 Aneurysm of the ascending aorta, without rupture: Secondary | ICD-10-CM

## 2016-11-06 NOTE — Progress Notes (Signed)
PCP is Estanislado Pandy, MD Referring Provider is [name], [name], MD  Chief Complaint  Patient presents with  . TAA    eval..Marland KitchenCTA CHEST 10/24/16    HPI:   Past Medical History:  Diagnosis Date  . Gout   . H/O inguinal hernia repair   . Hypertension   . MI, old   . Obesity   . Pulmonary sarcoidosis (HCC)    a. biopsy confirmed; s/p left testicle biopsy 2004 which showed non-caseating granuloma 2004    Past Surgical History:  Procedure Laterality Date  . LEFT HEART CATHETERIZATION WITH CORONARY ANGIOGRAM N/A 02/03/2014   Procedure: LEFT HEART CATHETERIZATION WITH CORONARY ANGIOGRAM;  Surgeon: Iran Ouch, MD;  Location: [facility] CATH LAB;  Service: Cardiovascular;  Laterality: N/A;  . LEFT HEART CATHETERIZATION WITH CORONARY ANGIOGRAM N/A 02/04/2014   Procedure: LEFT HEART CATHETERIZATION WITH CORONARY ANGIOGRAM;  Surgeon: Kathleene Hazel, MD;  Location: [facility] CATH LAB;  Service: Cardiovascular;  Laterality: N/A;    Family History  Problem Relation Age of Onset  . Hypertension Mother   . Heart attack Father   . Diabetes Father   . Stroke Father   . Heart attack Maternal Grandfather   . Heart attack Paternal Grandfather   . Heart attack Cousin      stent placed in 27s    Social History Social History  Substance Use Topics  . Smoking status: Never Smoker  . Smokeless tobacco: Never Used  . Alcohol use No    Current Outpatient Prescriptions  Medication Sig Dispense Refill  . aspirin EC 81 MG EC tablet Take 1 tablet (81 mg total) by mouth daily.    . citalopram (CELEXA) 20 MG tablet Take 20 mg by mouth daily.    . meloxicam (MOBIC) 15 MG tablet Take 15 mg by mouth daily.    . metoprolol tartrate (LOPRESSOR) 25 MG tablet Take 25 mg by mouth 2 (two) times daily.    . nitroGLYCERIN (NITROSTAT) 0.4 MG SL tablet Place 1 tablet (0.4 mg total) under the tongue every 5 (five) minutes as needed for chest pain. 25 tablet 3  . omeprazole (PRILOSEC OTC) 20 MG tablet Take 20 mg by  mouth daily as needed (for aci reflux).     No current facility-administered medications for this visit.     Allergies  Allergen Reactions  . Naproxen Other (See Comments)    bleeding    Review of Systems  Constitutional: Positive for activity change and fatigue.  HENT: Negative for trouble swallowing and voice change.   Eyes: Negative for visual disturbance.  Respiratory: Positive for cough.   Cardiovascular: Positive for chest pain. Negative for leg swelling.  Gastrointestinal: Positive for abdominal pain. Negative for blood in stool.  Genitourinary: Negative for difficulty urinating and dysuria.       Kidney stones  Musculoskeletal: Positive for joint swelling.  Neurological: Negative for dizziness and syncope.  Hematological: Negative for adenopathy. Does not bruise/bleed easily.  Psychiatric/Behavioral: Positive for dysphoric mood. The patient is nervous/anxious.   All other systems reviewed and are negative.   BP (!) 163/95 (BP Location: Right Arm, Patient Position: Sitting, Cuff Size: Large)   Pulse 61   Resp 16   Ht  (1.803 m)   Wt (!) 320 lb (145.2 kg)   SpO2 98% Comment: ON RA  BMI 44.63 kg/m  Physical Exam  Constitutional: He is oriented to person, place, and time. No distress.  Obese  HENT:  Head: Normocephalic and atraumatic.  Mouth/Throat: No oropharyngeal exudate.  Eyes: EOM are normal. Pupils are equal, round, and reactive to light. No scleral icterus.  Neck: Neck supple.  No carotid bruits  Cardiovascular: Normal rate, regular rhythm and normal heart sounds.   No murmur heard. Pulmonary/Chest: Effort normal and breath sounds normal. No respiratory distress. He has no wheezes. He has no rales.  Mild tenderness to palpation left costal margin  Abdominal: Soft. He exhibits no distension. There is no tenderness.  Musculoskeletal: He exhibits no edema.  Lymphadenopathy:    He has no cervical adenopathy.  Neurological: He is alert and oriented to  person, place, and time. No cranial nerve deficit.  Motor grossly intact  Vitals reviewed.    Diagnostic Tests: CT ANGIOGRAPHY CHEST WITH CONTRAST  TECHNIQUE: Multidetector CT imaging of the chest was performed using the standard protocol during bolus administration of intravenous contrast. Multiplanar CT image reconstructions and MIPs were obtained to evaluate the vascular anatomy.  CONTRAST:  100 mL Isovue 370 intravenous  COMPARISON:  Chest x-ray 10/24/2016, CT chest 02/02/2014  FINDINGS: Cardiovascular: Satisfactory opacification of the pulmonary arteries to the segmental level. No evidence of pulmonary embolism. Mild aneurysmal dilatation of the ascending aorta, measuring up to 4.1 cm in maximum diameter. No dissection. There are coronary artery calcifications. The heart size is upper normal. No significant pericardial effusion.  Mediastinum/Nodes: Stable subcentimeter mediastinal lymph nodes. No significant hilar adenopathy. Midline trachea. Thyroid normal. Esophagus within normal limits.  Lungs/Pleura: Lungs are clear. No pleural effusion or pneumothorax.  Upper Abdomen: Accessory splenule.  No acute abnormality.  Musculoskeletal: Osteophytosis.  No suspicious or acute bone lesion.  Review of the MIP images confirms the above findings.  IMPRESSION: 1. Negative for acute pulmonary embolus or aortic dissection 2. Mild aneurysmal dilatation of the ascending aorta up to 4.1 cm. Recommend annual imaging followup by CTA or MRA. This recommendation follows 2010 ACCF/AHA/AATS/ACR/ASA/SCA/SCAI/SIR/STS/SVM Guidelines for the Diagnosis and Management of Patients with Thoracic Aortic Disease. Circulation. 2010; 121: W119-J478 3. Clear lung fields.   Electronically Signed   By: Jasmine Pang M.D.   On: 10/24/2016 15:39 I personally reviewed the CT chest scan and concur with the findings noted above  Impression: Brett Harper is a 46 year old gentleman with a  history of hypertension, coronary disease and an MI 3 years ago. He recently presented [facility] with left-sided chest pain rating to his left arm. He ruled out for MI. A CT of the chest was done to rule out pulmonary embolus. There was no PE but he did have a 4.1 cm ascending aneurysm.  Ascending aneurysm- 4.1 cm. Well below the threshold for surgery which would be in the 5.5-6 cm range. He does need annual follow-up. I recommended MR rather than CT to avoid ionizing radiation, as he will likely need prolonged follow-up over time.   Hypertension- blood pressure elevated today. He was recently started on metoprolol 25 mg twice a day. He does have a history of some noncompliance with medications in the past, but says he is taking it. It may be elevated due to anxiety over seeing a surgeon. I recommended that he get a blood pressure cuff and check his blood pressure on regular basis with an ideal target of 130/80 or less.  Plan: Return in one year with MR angio of chest  Brett Slot, MD [facility] [phone]

## 2017-03-14 DIAGNOSIS — R0789 Other chest pain: Secondary | ICD-10-CM | POA: Diagnosis not present

## 2017-09-03 ENCOUNTER — Observation Stay (HOSPITAL_COMMUNITY)
Admission: EM | Admit: 2017-09-03 | Discharge: 2017-09-05 | Disposition: A | Discharge status: Home / Self Care | Payer: 59 | Attending: Internal Medicine | Admitting: Internal Medicine

## 2017-09-03 ENCOUNTER — Emergency Department (HOSPITAL_COMMUNITY): Payer: 59

## 2017-09-03 ENCOUNTER — Other Ambulatory Visit: Payer: Self-pay

## 2017-09-03 ENCOUNTER — Observation Stay (HOSPITAL_COMMUNITY): Payer: 59

## 2017-09-03 ENCOUNTER — Encounter (HOSPITAL_COMMUNITY): Payer: Self-pay | Admitting: Emergency Medicine

## 2017-09-03 DIAGNOSIS — R0789 Other chest pain: Secondary | ICD-10-CM | POA: Diagnosis not present

## 2017-09-03 DIAGNOSIS — R0602 Shortness of breath: Secondary | ICD-10-CM | POA: Diagnosis not present

## 2017-09-03 DIAGNOSIS — I25110 Atherosclerotic heart disease of native coronary artery with unstable angina pectoris: Secondary | ICD-10-CM | POA: Diagnosis not present

## 2017-09-03 DIAGNOSIS — I10 Essential (primary) hypertension: Secondary | ICD-10-CM | POA: Diagnosis not present

## 2017-09-03 DIAGNOSIS — I441 Atrioventricular block, second degree: Secondary | ICD-10-CM | POA: Diagnosis not present

## 2017-09-03 DIAGNOSIS — I252 Old myocardial infarction: Secondary | ICD-10-CM | POA: Diagnosis not present

## 2017-09-03 DIAGNOSIS — R079 Chest pain, unspecified: Secondary | ICD-10-CM | POA: Diagnosis not present

## 2017-09-03 DIAGNOSIS — D860 Sarcoidosis of lung: Secondary | ICD-10-CM | POA: Diagnosis not present

## 2017-09-03 DIAGNOSIS — Z6841 Body Mass Index (BMI) 40.0 and over, adult: Secondary | ICD-10-CM | POA: Insufficient documentation

## 2017-09-03 DIAGNOSIS — G4733 Obstructive sleep apnea (adult) (pediatric): Secondary | ICD-10-CM | POA: Diagnosis not present

## 2017-09-03 DIAGNOSIS — I712 Thoracic aortic aneurysm, without rupture: Secondary | ICD-10-CM | POA: Diagnosis not present

## 2017-09-03 DIAGNOSIS — Z8249 Family history of ischemic heart disease and other diseases of the circulatory system: Secondary | ICD-10-CM | POA: Insufficient documentation

## 2017-09-03 DIAGNOSIS — Z9119 Patient's noncompliance with other medical treatment and regimen: Secondary | ICD-10-CM | POA: Diagnosis not present

## 2017-09-03 DIAGNOSIS — Z7982 Long term (current) use of aspirin: Secondary | ICD-10-CM | POA: Diagnosis not present

## 2017-09-03 DIAGNOSIS — I7121 Aneurysm of the ascending aorta, without rupture: Secondary | ICD-10-CM | POA: Diagnosis present

## 2017-09-03 DIAGNOSIS — Z955 Presence of coronary angioplasty implant and graft: Secondary | ICD-10-CM | POA: Insufficient documentation

## 2017-09-03 DIAGNOSIS — I2510 Atherosclerotic heart disease of native coronary artery without angina pectoris: Secondary | ICD-10-CM | POA: Diagnosis present

## 2017-09-03 DIAGNOSIS — I200 Unstable angina: Secondary | ICD-10-CM | POA: Diagnosis present

## 2017-09-03 DIAGNOSIS — M109 Gout, unspecified: Secondary | ICD-10-CM | POA: Diagnosis not present

## 2017-09-03 DIAGNOSIS — E669 Obesity, unspecified: Secondary | ICD-10-CM | POA: Diagnosis present

## 2017-09-03 DIAGNOSIS — E785 Hyperlipidemia, unspecified: Secondary | ICD-10-CM | POA: Diagnosis not present

## 2017-09-03 HISTORY — DX: Essential (primary) hypertension: I10

## 2017-09-03 HISTORY — DX: Atherosclerotic heart disease of native coronary artery without angina pectoris: I25.10

## 2017-09-03 HISTORY — DX: Non-ST elevation (NSTEMI) myocardial infarction: I21.4

## 2017-09-03 LAB — TROPONIN I: Troponin I: <0.03 ng/mL (ref <0.03)

## 2017-09-03 LAB — BASIC METABOLIC PANEL
Anion gap: 13 (ref 5–15)
BUN: 14 mg/dL (ref 6–20)
CO2: 22 mmol/L (ref 22–32)
CREATININE: 1.04 mg/dL (ref 0.61–1.24)
Calcium: 9.1 mg/dL (ref 8.9–10.3)
Chloride: 107 mmol/L (ref 101–111)
GFR calc Af Amer: >60 mL/min (ref >60)
GLUCOSE: 99 mg/dL (ref 65–99)
Potassium: 3.6 mmol/L (ref 3.5–5.1)
SODIUM: 142 mmol/L (ref 135–145)

## 2017-09-03 LAB — CBC WITH DIFFERENTIAL/PLATELET
Basophils Absolute: 0 10*3/uL (ref 0.0–0.1)
Basophils Relative: 0 %
EOS ABS: 0.2 10*3/uL (ref 0.0–0.7)
EOS PCT: 2 %
HCT: 40.7 % (ref 39.0–52.0)
Hemoglobin: 13.4 g/dL (ref 13.0–17.0)
LYMPHS ABS: 2.9 10*3/uL (ref 0.7–4.0)
Lymphocytes Relative: 28 %
MCH: 29.4 pg (ref 26.0–34.0)
MCHC: 32.9 g/dL (ref 30.0–36.0)
MCV: 89.3 fL (ref 78.0–100.0)
MONO ABS: 1.1 10*3/uL — AB (ref 0.1–1.0)
MONOS PCT: 11 %
Neutro Abs: 6.2 10*3/uL (ref 1.7–7.7)
Neutrophils Relative %: 59 %
PLATELETS: 279 10*3/uL (ref 150–400)
RBC: 4.56 MIL/uL (ref 4.22–5.81)
RDW: 12.4 % (ref 11.5–15.5)
WBC: 10.4 10*3/uL (ref 4.0–10.5)

## 2017-09-03 MED ORDER — ONDANSETRON HCL 4 MG/2ML IJ SOLN: 4.0000 mg | Freq: Four times a day (QID) | INTRAMUSCULAR | Status: DC | PRN

## 2017-09-03 MED ORDER — ALPRAZOLAM 0.25 MG PO TABS
0.2500 mg | ORAL_TABLET | Freq: Two times a day (BID) | ORAL | Status: DC | PRN
  Administered 2017-09-04 (×2): 0.25 mg via ORAL
  Filled 2017-09-03 (×2): qty 1

## 2017-09-03 MED ORDER — SODIUM CHLORIDE 0.9 % IV SOLN
INTRAVENOUS | Status: DC
  Administered 2017-09-04: via INTRAVENOUS

## 2017-09-03 MED ORDER — NITROGLYCERIN 2 % TD OINT
1.0000 [in_us] | TOPICAL_OINTMENT | Freq: Four times a day (QID) | TRANSDERMAL | Status: DC
  Administered 2017-09-03 – 2017-09-04 (×3): 1 [in_us] via TOPICAL
  Filled 2017-09-03: qty 1
  Filled 2017-09-03 (×21): qty 30
  Filled 2017-09-03: qty 1
  Filled 2017-09-03 (×8): qty 30

## 2017-09-03 MED ORDER — HYDRALAZINE HCL 20 MG/ML IJ SOLN
10.0000 mg | Freq: Three times a day (TID) | INTRAMUSCULAR | Status: DC | PRN
  Administered 2017-09-04: 17:00:00 10 mg via INTRAVENOUS
  Filled 2017-09-03: qty 1

## 2017-09-03 MED ORDER — IOPAMIDOL (ISOVUE-370) INJECTION 76%
100.0000 mL | Freq: Once | INTRAVENOUS | Status: AC | PRN
  Administered 2017-09-03: 100 mL via INTRAVENOUS

## 2017-09-03 MED ORDER — MORPHINE SULFATE (PF) 4 MG/ML IV SOLN
4.0000 mg | INTRAVENOUS | Status: DC | PRN
  Administered 2017-09-03 – 2017-09-04 (×2): 4 mg via INTRAVENOUS
  Filled 2017-09-03 (×3): qty 1

## 2017-09-03 MED ORDER — ASPIRIN 81 MG PO CHEW
324.0000 mg | CHEWABLE_TABLET | Freq: Once | ORAL | Status: AC
  Administered 2017-09-03: 324 mg via ORAL
  Filled 2017-09-03: qty 4

## 2017-09-03 MED ORDER — ACETAMINOPHEN 325 MG PO TABS
650.0000 mg | ORAL_TABLET | ORAL | Status: DC | PRN
  Administered 2017-09-04 – 2017-09-05 (×2): 650 mg via ORAL
  Filled 2017-09-03 (×2): qty 2

## 2017-09-03 MED ORDER — METOPROLOL TARTRATE 25 MG PO TABS
25.0000 mg | ORAL_TABLET | Freq: Two times a day (BID) | ORAL | Status: DC
  Administered 2017-09-04: 25 mg via ORAL
  Filled 2017-09-03: qty 1

## 2017-09-03 MED ORDER — ASPIRIN EC 325 MG PO TBEC: 325.0000 mg | DELAYED_RELEASE_TABLET | Freq: Every day | ORAL | Status: DC

## 2017-09-03 MED ORDER — NITROGLYCERIN 0.4 MG SL SUBL
0.4000 mg | SUBLINGUAL_TABLET | SUBLINGUAL | Status: DC | PRN
  Administered 2017-09-03 (×3): 0.4 mg via SUBLINGUAL
  Filled 2017-09-03: qty 1

## 2017-09-03 MED ORDER — MORPHINE SULFATE (PF) 4 MG/ML IV SOLN
INTRAVENOUS | Status: AC
  Administered 2017-09-03: 4 mg
  Filled 2017-09-03: qty 1

## 2017-09-03 MED ORDER — ZOLPIDEM TARTRATE 5 MG PO TABS
5.0000 mg | ORAL_TABLET | Freq: Every evening | ORAL | Status: DC | PRN
  Administered 2017-09-04: 22:00:00 5 mg via ORAL
  Filled 2017-09-03: qty 1

## 2017-09-03 MED ORDER — ENOXAPARIN SODIUM 40 MG/0.4ML ~~LOC~~ SOLN
40.0000 mg | SUBCUTANEOUS | Status: DC
  Administered 2017-09-04: 40 mg via SUBCUTANEOUS
  Filled 2017-09-03: qty 0.4

## 2017-09-03 MED ORDER — PANTOPRAZOLE SODIUM 40 MG IV SOLR
40.0000 mg | Freq: Two times a day (BID) | INTRAVENOUS | Status: DC
  Administered 2017-09-04 (×3): 40 mg via INTRAVENOUS
  Filled 2017-09-03 (×4): qty 40

## 2017-09-03 NOTE — Plan of Care (Signed)
  Progressing Education: Knowledge of General Education information will improve 09/03/2017 2249 - Progressing by Wynne Dusthomas, Ronette Hank, RN Clinical Measurements: Diagnostic test results will improve 09/03/2017 2249 - Progressing by Wynne Dusthomas, Markanthony Gedney, RN Activity: Risk for activity intolerance will decrease 09/03/2017 2249 - Progressing by Wynne Dusthomas, [place] Giammona, RN Coping: Level of anxiety will decrease 09/03/2017 2249 - Progressing by Wynne Dusthomas, Ishika Chesterfield, RN

## 2017-09-03 NOTE — ED Triage Notes (Signed)
Patient reports chest pain that started yesterday. Patient reports nausea, weakness and SOB. Patient states his pain has become worse today.

## 2017-09-03 NOTE — ED Notes (Signed)
Per Dr Melynda RippleHobbs, allow pt to have CT done while in the ER and dont send the PT up until CT is resulted and results are called to him.

## 2017-09-03 NOTE — ED Provider Notes (Signed)
[facility] Provider Note   CSN: [id number] Arrival date & time: 09/03/17  1642     History   Chief Complaint Chief Complaint  Patient presents with  . Chest Pain    HPI Brett Harper is a 47 y.o. male.  HPI  The pt is a 47 y/o male -he has a known history of myocardial infarction which occurred several years ago, he was initially on Effient, he states that he no longer takes this medication, he does take a baby aspirin and metoprolol, he states that he is compliant with his medications.  He was in his usual state of health until yesterday when he was at work and was developing a headache, checked his blood pressure and it was over 200 systolic.  He went home and rested and today when he woke up he was having heaviness on the left side of the chest.  He describes it as a severe heaviness, it does radiate somewhat to his left arm, there is a feeling of shortness of breath but no nausea vomiting or diaphoresis and he denies any swelling of the legs.  No coughing, fevers, back pain, his headache is resolved and he no longer has any symptoms in the head.  No blurred vision, no diarrhea.  Normal oral intake.  He reports having 3 stents, I have verified this with my review of the medical record, he has formally been seen by Dr. Wyline MoodBranch with cardiology.  Most recently was several years ago.  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  This was 4.1 cm, he was seen by vascular and no further investigation was done at that time, he was supposed to have yearly surveillance, it has not come to the year market.  Past Medical History:  Diagnosis Date  . Gout   . H/O inguinal hernia repair   . Hypertension   . MI, old   . Obesity   . Pulmonary sarcoidosis (HCC)    a. 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 Ascending aortic aneurysm  (HCC) 11/06/2016  . Rash 04/09/2014  . Coronary atherosclerosis of native coronary artery 02/05/2014  . Dyslipidemia 02/04/2014  . Family history of coronary artery disease in father 02/04/2014  . Anxiety 02/04/2014  . NSTEMI (non-ST elevated myocardial infarction) (HCC) 02/03/2014  . Chest pain 02/02/2014  . Hypertension   . Pulmonary sarcoidosis (HCC)   . H/O inguinal hernia repair   . Gout   . Obesity- BMI 47     Past Surgical History:  Procedure Laterality Date  . LEFT HEART CATHETERIZATION WITH CORONARY ANGIOGRAM N/A 02/03/2014   Procedure: LEFT HEART CATHETERIZATION WITH CORONARY ANGIOGRAM;  Surgeon: Iran OuchMuhammad A Arida, MD;  Location: [facility] CATH LAB;  Service: Cardiovascular;  Laterality: N/A;  . LEFT HEART CATHETERIZATION WITH CORONARY ANGIOGRAM N/A 02/04/2014   Procedure: LEFT HEART CATHETERIZATION WITH CORONARY ANGIOGRAM;  Surgeon: Kathleene Hazelhristopher D McAlhany, MD;  [facility];  Service: Cardiovascular;  Laterality: N/A;       Home Medications    Prior to Admission medications   Medication Sig Start Date End Date Taking? Authorizing Provider  aspirin EC 81 MG EC tablet Take 1 tablet (81 mg total) by mouth daily. 02/05/14   Barrett, Joline Salthonda G, PA-C  citalopram (CELEXA) 20 MG tablet Take 20 mg by mouth daily.    [provider]  meloxicam (MOBIC) 15 MG tablet Take 15 mg by mouth daily.    [provider]  metoprolol tartrate (LOPRESSOR) 25 MG tablet Take 25 mg by mouth 2 (two) times daily.    [provider]  nitroGLYCERIN (NITROSTAT) 0.4 MG SL tablet Place 1 tablet (0.4 mg total) under the tongue every 5 (five) minutes as needed for chest pain. 02/05/14   Barrett, Joline Salt, PA-C  omeprazole (PRILOSEC OTC) 20 MG tablet Take 20 mg by mouth daily as needed (for aci reflux).    [provider]    Family History Family History  Problem Relation Age of Onset  . Hypertension Mother   . Heart attack Father   . Diabetes Father   . Stroke Father     . Heart attack Maternal Grandfather   . Heart attack Paternal Grandfather   . Heart attack Cousin         stent placed in 57s    Social History Social History   Tobacco Use  . Smoking status: Never Smoker  . Smokeless tobacco: Never Used  Substance Use Topics  . Alcohol use: No  . Drug use: No     Allergies   Naproxen   Review of Systems Review of Systems  All other systems reviewed and are negative.    Physical Exam Updated Vital Signs BP (!) 140/103   Pulse 85   Temp 98.9 F (37.2 C) (Oral)   Resp 18   Ht 5\' 11"  (1.803 m)   Wt (!) 145.2 kg (320 lb)   SpO2 96%   BMI 44.63 kg/m   Physical Exam  Constitutional: He appears well-developed and well-nourished. No distress.  HENT:  Head: Normocephalic and atraumatic.  Mouth/Throat: Oropharynx is clear and moist. No oropharyngeal exudate.  Eyes: Conjunctivae and EOM are normal. Pupils are equal, round, and reactive to light. Right eye exhibits no discharge. Left eye exhibits no discharge. No scleral icterus.  Neck: Normal range of motion. Neck supple. No JVD present. No thyromegaly present.  Cardiovascular: Regular rhythm, normal heart sounds and intact distal pulses. Exam reveals no gallop and no friction rub.  No murmur heard. Mild tachycardia  Pulmonary/Chest: Effort normal and breath sounds normal. No respiratory distress. He has no wheezes. He has no rales.  Abdominal: Soft. Bowel sounds are normal. He exhibits no distension and no mass. There is no tenderness.  Musculoskeletal: Normal range of motion. He exhibits no edema or tenderness.  Lymphadenopathy:    He has no cervical adenopathy.  Neurological: He is alert. Coordination normal.  Skin: Skin is warm and dry. No rash noted. No erythema.  Psychiatric: He has a normal mood and affect. His behavior is normal.  Nursing note and vitals reviewed.    ED Treatments / Results  Labs (all labs ordered are listed, but only abnormal results are  displayed) Labs Reviewed  CBC WITH DIFFERENTIAL/PLATELET - Abnormal; Notable for the following components:      Result Value   Monocytes Absolute 1.1 (*)    All other components within normal limits  TROPONIN I  BASIC METABOLIC PANEL  RAPID URINE DRUG SCREEN, HOSP PERFORMED    ED ECG REPORT  I personally interpreted this EKG   Date: 09/03/2017   Rate: 111  Rhythm: sinus tachycardia  QRS Axis: normal  Intervals: normal  ST/T Wave abnormalities: nonspecific T wave changes  Conduction Disutrbances:none  Narrative Interpretation:   Old EKG Reviewed: unchanged   Radiology Dg Chest 2 View  Result Date: 09/03/2017  CLINICAL DATA:  Shortness of breath EXAM: CHEST  2 VIEW COMPARISON:  October 24 2016 FINDINGS: The heart size and mediastinal contours are stable. The heart size is enlarged. The lung volumes are low. Both lungs are clear. The visualized skeletal structures are unremarkable. IMPRESSION: No active cardiopulmonary disease. Electronically Signed   By: Sherian Rein M.D.   On: 09/03/2017 18:29    Procedures Procedures (including critical care time)  Medications Ordered in ED Medications  aspirin chewable tablet 324 mg (not administered)  nitroGLYCERIN (NITROSTAT) SL tablet 0.4 mg (not administered)     Initial Impression / Assessment and Plan / ED Course  I have reviewed the triage vital signs and the nursing notes.  Pertinent labs & imaging results that were available during my care of the patient were reviewed by me and considered in my medical decision making (see chart for details).     Patient has ongoing chest pain which could be related to an ischemic episode given his history, he is no longer on significant anticoagulation and given his mild tachycardia I suspect he is out of his beta-blocker or not compliant with it.  At this time the patient will be given aspirin, x-ray, labs, cardiac monitoring and oxygen treatment.  I suspect he will need to have further  evaluation in the hospital given his significant chest pressure today.  I do not think this is pulmonary embolism, he has normal oxygenation, no edema of the legs and no risk otherwise for pulmonary embolism.  Troponin negative, the patient still has ongoing symptoms, discussed with the hospitalist who will admit.  Will likely need further testing for cardiac rule out  Final Clinical Impressions(s) / ED Diagnoses   Final diagnoses:  Left sided chest pain    ED Discharge Orders    None      Eber Hong, MD 09/03/17 [phone]

## 2017-09-03 NOTE — H&P (Addendum)
[facility] History and Physical  Brett Harper NFA:[id number] DOB: [birth year] DOA: 09/03/2017  Referring physician: PCP: Estanislado Pandy, MD   Chief Complaint: "My chest."  HPI: Brett Harper is a 47 y.o. male with past medical history significant for gout, aortic aneurysm hypertension, heart attack and pulmonary sarcoidosis presents to the emergency room with chest pain.  Patient states he had a sudden onset of chest pain this morning.  Went to work.  Pain did not get better throughout the day.  Patient did not try any sublingual nitroglycerin throughout the day.  Patient came to the emergency room due to continued pain.  He states that his chest pain is a pressure primarily on the left chest radiates to the left arm.  No nausea or vomiting.  No diaphoresis.  No chest trauma.  No recent cough.  No recent medication changes.  ED course: Chest x-ray checked.  EKG read is no ST elevation.  Initial troponin negative.  Hospitalist consulted for admission.   Review of Systems:  As per HPI otherwise 10 point review of systems negative.    Past Medical History:  Diagnosis Date  . Gout   . H/O inguinal hernia repair   . Hypertension   . MI, old   . Obesity   . Pulmonary sarcoidosis (HCC)    a. biopsy confirmed; s/p left testicle biopsy 2004 which showed non-caseating granuloma 2004   Past Surgical History:  Procedure Laterality Date  . LEFT HEART CATHETERIZATION WITH CORONARY ANGIOGRAM N/A 02/03/2014   Procedure: LEFT HEART CATHETERIZATION WITH CORONARY ANGIOGRAM;  Surgeon: Iran Ouch, MD;  Location: [facility] CATH LAB;  Service: Cardiovascular;  Laterality: N/A;  . LEFT HEART CATHETERIZATION WITH CORONARY ANGIOGRAM N/A 02/04/2014   Procedure: LEFT HEART CATHETERIZATION WITH CORONARY ANGIOGRAM;  Surgeon: Kathleene Hazel, MD;  Location: [facility] CATH LAB;  Service: Cardiovascular;  Laterality: N/A;   Social History:  reports that  has never smoked. he has never used smokeless tobacco.  He reports that he does not drink alcohol or use drugs.  Allergies  Allergen Reactions  . Naproxen Other (See Comments)    bleeding    Family History  Problem Relation Age of Onset  . Hypertension Mother   . Heart attack Father   . Diabetes Father   . Stroke Father   . Heart attack Maternal Grandfather   . Heart attack Paternal Grandfather   . Heart attack Cousin         stent placed in 40s     Prior to Admission medications   Medication Sig Start Date End Date Taking? Authorizing Provider  aspirin EC 81 MG EC tablet Take 1 tablet (81 mg total) by mouth daily. 02/05/14  Yes Barrett, Joline Salt, PA-C  meloxicam (MOBIC) 15 MG tablet Take 15 mg by mouth daily.   Yes [provider]  metoprolol tartrate (LOPRESSOR) 25 MG tablet Take 25 mg by mouth 2 (two) times daily.   Yes [provider]  nitroGLYCERIN (NITROSTAT) 0.4 MG SL tablet Place 1 tablet (0.4 mg total) under the tongue every 5 (five) minutes as needed for chest pain. 02/05/14  Yes Barrett, Joline Salt, PA-C  omeprazole (PRILOSEC OTC) 20 MG tablet Take 20 mg by mouth daily.    Yes [provider]   Physical Exam: Vitals:   09/03/17 1930 09/03/17 1952 09/03/17 1955 09/03/17 2000  BP: (!) 152/117 (!) 141/103  (!) 138/93  Pulse: 76 98 94 100  Resp:  (!) 31 (!) 24 19  Temp:      TempSrc:      SpO2: 99% 97% 98% 94%  Weight:      Height:        Wt Readings from Last 3 Encounters:  09/03/17 (!) 145.2 kg (320 lb)  11/06/16 (!) 145.2 kg (320 lb)  10/24/16 (!) 140.6 kg (310 lb)    General:  Appears calm and comfortable; A&Ox3, in pain distress Eyes:  PERRL, EOMI, normal lids, iris ENT:  grossly normal hearing, lips & tongue Neck:  no LAD, masses or thyromegaly Cardiovascular:  RRR, no m/r/g. No LE edema. Non reproducible CP. Respiratory:  CTA bilaterally, no w/r/r. Normal respiratory effort. Abdomen:  soft, ntnd Skin:  no rash or induration seen on limited exam Musculoskeletal:  grossly normal tone  BUE/BLE Psychiatric:  grossly normal mood and affect, speech fluent and appropriate Neurologic:  CN 2-12 grossly intact, moves all extremities in coordinated fashion.          Labs on Admission:  Basic Metabolic Panel: Recent Labs  Lab 09/03/17 1713  NA 142  K 3.6  CL 107  CO2 22  GLUCOSE 99  BUN 14  CREATININE 1.04  CALCIUM 9.1   Liver Function Tests: No results for input(s): AST, ALT, ALKPHOS, BILITOT, PROT, ALBUMIN in the last 168 hours. No results for input(s): LIPASE, AMYLASE in the last 168 hours. No results for input(s): AMMONIA in the last 168 hours. CBC: Recent Labs  Lab 09/03/17 1713  WBC 10.4  NEUTROABS 6.2  HGB 13.4  HCT 40.7  MCV 89.3  PLT 279   Cardiac Enzymes: Recent Labs  Lab 09/03/17 1713  TROPONINI <0.03    BNP (last 3 results) No results for input(s): BNP in the last 8760 hours.  ProBNP (last 3 results) No results for input(s): PROBNP in the last 8760 hours.   Serum creatinine: 1.04 mg/dL 16/05/9600/29/19 [id number] Estimated creatinine clearance: 126.9 mL/min  CBG: No results for input(s): GLUCAP in the last 168 hours.  Radiological Exams on Admission: Dg Chest 2 View  Result Date: 09/03/2017 CLINICAL DATA:  Shortness of breath EXAM: CHEST  2 VIEW COMPARISON:  October 24 2016 FINDINGS: The heart size and mediastinal contours are stable. The heart size is enlarged. The lung volumes are low. Both lungs are clear. The visualized skeletal structures are unremarkable. IMPRESSION: No active cardiopulmonary disease. Electronically Signed   By: Sherian ReinWei-Chen  Lin M.D.   On: 09/03/2017 18:29    EKG: Independently reviewed. NSR. Abn R wave progression with early transition.  No STEMI.  Assessment/Plan Active Problems:   Chest pain  CP - serial trop ordered, initial neg - prn EKG CP - prn moprhine CP  - prn ntg cp-->nitro paste - asa in ED and QD - ECHO ordered for AM - tele bed, cardiac monitoring - ambien for sleep prn - zofran prn for nausea CTA to  r/o dissection pending Pulses and BP in both arms good Spoke to cardiology fellow Dr. Shirlee LatchMcLean who advises monitoring overnight cycle troponins.  Start heparin if troponin positive.  Cardiology consult in a.m.  Hypertension When necessary hydralazine 10 mg IV as needed for severe blood pressure Cont lopressor  Code Status: FC DVT Prophylaxis: SCDs Family Communication: none available Disposition Plan: Pending Improvement  Status: tele, obs  Haydee SalterPhillip M Ellamae Lybeck, MD Family Medicine [facility] [email or web address] Password [email or web address]

## 2017-09-04 ENCOUNTER — Encounter (HOSPITAL_COMMUNITY): Payer: Self-pay | Admitting: Internal Medicine

## 2017-09-04 ENCOUNTER — Observation Stay (HOSPITAL_BASED_OUTPATIENT_CLINIC_OR_DEPARTMENT_OTHER): Payer: 59

## 2017-09-04 ENCOUNTER — Other Ambulatory Visit: Payer: Self-pay

## 2017-09-04 ENCOUNTER — Encounter (HOSPITAL_COMMUNITY)
Admission: EM | Disposition: A | Discharge status: Home / Self Care | Payer: Self-pay | Source: Home / Self Care | Attending: Emergency Medicine

## 2017-09-04 DIAGNOSIS — D860 Sarcoidosis of lung: Secondary | ICD-10-CM | POA: Diagnosis not present

## 2017-09-04 DIAGNOSIS — R079 Chest pain, unspecified: Secondary | ICD-10-CM | POA: Diagnosis not present

## 2017-09-04 DIAGNOSIS — I25110 Atherosclerotic heart disease of native coronary artery with unstable angina pectoris: Secondary | ICD-10-CM | POA: Diagnosis not present

## 2017-09-04 DIAGNOSIS — I252 Old myocardial infarction: Secondary | ICD-10-CM | POA: Diagnosis not present

## 2017-09-04 DIAGNOSIS — I200 Unstable angina: Secondary | ICD-10-CM | POA: Diagnosis not present

## 2017-09-04 DIAGNOSIS — I712 Thoracic aortic aneurysm, without rupture: Secondary | ICD-10-CM | POA: Diagnosis not present

## 2017-09-04 DIAGNOSIS — I10 Essential (primary) hypertension: Secondary | ICD-10-CM | POA: Diagnosis not present

## 2017-09-04 DIAGNOSIS — I441 Atrioventricular block, second degree: Secondary | ICD-10-CM | POA: Diagnosis not present

## 2017-09-04 DIAGNOSIS — E782 Mixed hyperlipidemia: Secondary | ICD-10-CM

## 2017-09-04 HISTORY — PX: LEFT HEART CATH AND CORONARY ANGIOGRAPHY: CATH118249

## 2017-09-04 LAB — CBC
HCT: 39.6 % (ref 39.0–52.0)
HEMOGLOBIN: 13.2 g/dL (ref 13.0–17.0)
MCH: 30.2 pg (ref 26.0–34.0)
MCHC: 33.3 g/dL (ref 30.0–36.0)
MCV: 90.6 fL (ref 78.0–100.0)
Platelets: 258 10*3/uL (ref 150–400)
RBC: 4.37 MIL/uL (ref 4.22–5.81)
RDW: 13 % (ref 11.5–15.5)
WBC: 8.5 10*3/uL (ref 4.0–10.5)

## 2017-09-04 LAB — ECHOCARDIOGRAM COMPLETE
AVLVOTPG: 2 mmHg
CHL CUP DOP CALC LVOT VTI: 18.5 cm
CHL CUP RV SYS PRESS: 35 mmHg
CHL CUP STROKE VOLUME: 71 mL
E/e' ratio: 9.16
EWDT: 222 ms
FS: 32 % (ref 28–44)
Height: 71 in
IV/PV OW: 1.11
LA ID, A-P, ES: 41 mm
LADIAMINDEX: 1.48 cm/m2
LAVOL: 68.7 mL
LAVOLA4C: 70.8 mL
LAVOLIN: 24.7 mL/m2
LDCA: 3.8 cm2
LEFT ATRIUM END SYS DIAM: 41 mm
LV E/e' medial: 9.16
LV PW d: 11.4 mm — AB (ref 0.6–1.1)
LV SIMPSON'S DISK: 58
LV TDI E'MEDIAL: 6.2
LV dias vol: 122 mL (ref 62–150)
LV e' LATERAL: 8.49 cm/s
LV sys vol index: 19 mL/m2
LV sys vol: 51 mL
LVDIAVOLIN: 44 mL/m2
LVEEAVG: 9.16
LVOT peak vel: 77.8 cm/s
LVOTD: 22 mm
LVOTSV: 70 mL
Lateral S' vel: 12.5 cm/s
MV Dec: 222
MV Peak grad: 2 mmHg
MVPKAVEL: 89.1 m/s
MVPKEVEL: 77.8 m/s
RV TAPSE: 20.9 mm
Reg peak vel: 261 cm/s
TDI e' lateral: 8.49
TRMAXVEL: 261 cm/s
Weight: 5157 oz

## 2017-09-04 LAB — RAPID URINE DRUG SCREEN, HOSP PERFORMED
Amphetamines: NOT DETECTED
BENZODIAZEPINES: POSITIVE — AB
Barbiturates: NOT DETECTED
COCAINE: NOT DETECTED
OPIATES: POSITIVE — AB
Tetrahydrocannabinol: NOT DETECTED

## 2017-09-04 LAB — TROPONIN I: Troponin I: <0.03 ng/mL (ref <0.03)

## 2017-09-04 LAB — PROTIME-INR
INR: 1.06
Prothrombin Time: 13.7 seconds (ref 11.4–15.2)

## 2017-09-04 LAB — CREATININE, SERUM: CREATININE: 1.14 mg/dL (ref 0.61–1.24)

## 2017-09-04 SURGERY — LEFT HEART CATH AND CORONARY ANGIOGRAPHY
Anesthesia: LOCAL

## 2017-09-04 MED ORDER — IOPAMIDOL (ISOVUE-370) INJECTION 76%
INTRAVENOUS | Status: AC
  Filled 2017-09-04: qty 100

## 2017-09-04 MED ORDER — ATORVASTATIN CALCIUM 80 MG PO TABS
80.0000 mg | ORAL_TABLET | Freq: Every day | ORAL | Status: DC
  Administered 2017-09-04: 80 mg via ORAL
  Filled 2017-09-04: qty 1

## 2017-09-04 MED ORDER — MIDAZOLAM HCL 2 MG/2ML IJ SOLN
INTRAMUSCULAR | Status: DC | PRN
  Administered 2017-09-04 (×2): 1 mg via INTRAVENOUS

## 2017-09-04 MED ORDER — ASPIRIN 325 MG PO TABS
ORAL_TABLET | ORAL | Status: DC | PRN
  Administered 2017-09-04: 325 mg via ORAL

## 2017-09-04 MED ORDER — HYDRALAZINE HCL 20 MG/ML IJ SOLN
10.0000 mg | Freq: Four times a day (QID) | INTRAMUSCULAR | Status: DC | PRN
Start: 2017-09-04 — End: 2017-09-05
  Administered 2017-09-04: 10 mg via INTRAVENOUS
  Filled 2017-09-04: qty 1

## 2017-09-04 MED ORDER — ASPIRIN 81 MG PO CHEW
81.0000 mg | CHEWABLE_TABLET | Freq: Every day | ORAL | Status: DC
  Filled 2017-09-04: qty 1

## 2017-09-04 MED ORDER — FENTANYL CITRATE (PF) 100 MCG/2ML IJ SOLN
INTRAMUSCULAR | Status: AC
  Filled 2017-09-04: qty 2

## 2017-09-04 MED ORDER — SODIUM CHLORIDE 0.9% FLUSH
3.0000 mL | Freq: Two times a day (BID) | INTRAVENOUS | Status: DC
  Administered 2017-09-04: 17:00:00 3 mL via INTRAVENOUS

## 2017-09-04 MED ORDER — HEPARIN (PORCINE) IN NACL 2-0.9 UNIT/ML-% IJ SOLN
INTRAMUSCULAR | Status: AC
  Filled 2017-09-04: qty 1000

## 2017-09-04 MED ORDER — IOPAMIDOL (ISOVUE-370) INJECTION 76%
INTRAVENOUS | Status: DC | PRN
  Administered 2017-09-04: 50 mL via INTRAVENOUS

## 2017-09-04 MED ORDER — MIDAZOLAM HCL 2 MG/2ML IJ SOLN
INTRAMUSCULAR | Status: AC
  Filled 2017-09-04: qty 2

## 2017-09-04 MED ORDER — KETOROLAC TROMETHAMINE 30 MG/ML IJ SOLN
30.0000 mg | Freq: Once | INTRAMUSCULAR | Status: AC
  Administered 2017-09-04: 30 mg via INTRAVENOUS
  Filled 2017-09-04: qty 1

## 2017-09-04 MED ORDER — VERAPAMIL HCL 2.5 MG/ML IV SOLN
INTRAVENOUS | Status: AC
  Filled 2017-09-04: qty 2

## 2017-09-04 MED ORDER — FENTANYL CITRATE (PF) 100 MCG/2ML IJ SOLN
INTRAMUSCULAR | Status: DC | PRN
  Administered 2017-09-04: 50 ug via INTRAVENOUS

## 2017-09-04 MED ORDER — HEPARIN BOLUS VIA INFUSION
4000.0000 [IU] | Freq: Once | INTRAVENOUS | Status: AC
  Administered 2017-09-04: 4000 [IU] via INTRAVENOUS
  Filled 2017-09-04: qty 4000

## 2017-09-04 MED ORDER — SODIUM CHLORIDE 0.9 % IV SOLN: 250.0000 mL | INTRAVENOUS | Status: DC | PRN

## 2017-09-04 MED ORDER — SODIUM CHLORIDE 0.9% FLUSH: 3.0000 mL | INTRAVENOUS | Status: DC | PRN

## 2017-09-04 MED ORDER — FAMOTIDINE IN NACL 20-0.9 MG/50ML-% IV SOLN: 20.0000 mg | Freq: Once | INTRAVENOUS | Status: DC

## 2017-09-04 MED ORDER — LIDOCAINE HCL (PF) 1 % IJ SOLN
INTRAMUSCULAR | Status: AC
  Filled 2017-09-04: qty 30

## 2017-09-04 MED ORDER — ENOXAPARIN SODIUM 40 MG/0.4ML ~~LOC~~ SOLN: 40.0000 mg | SUBCUTANEOUS | Status: DC

## 2017-09-04 MED ORDER — SODIUM CHLORIDE 0.9 % IV SOLN: INTRAVENOUS | Status: AC

## 2017-09-04 MED ORDER — HEPARIN (PORCINE) IN NACL 100-0.45 UNIT/ML-% IJ SOLN
1300.0000 [IU]/h | INTRAMUSCULAR | Status: DC
  Administered 2017-09-04: 1300 [IU]/h via INTRAVENOUS
  Filled 2017-09-04: qty 250

## 2017-09-04 MED ORDER — ASPIRIN 325 MG PO TABS
ORAL_TABLET | ORAL | Status: AC
  Filled 2017-09-04: qty 1

## 2017-09-04 MED ORDER — HEPARIN (PORCINE) IN NACL 2-0.9 UNIT/ML-% IJ SOLN
INTRAMUSCULAR | Status: AC | PRN
  Administered 2017-09-04: 1000 mL via INTRA_ARTERIAL

## 2017-09-04 MED ORDER — METOPROLOL TARTRATE 25 MG PO TABS
25.0000 mg | ORAL_TABLET | Freq: Two times a day (BID) | ORAL | Status: DC
  Administered 2017-09-04 (×2): 25 mg via ORAL
  Filled 2017-09-04 (×2): qty 1

## 2017-09-04 MED ORDER — HEPARIN (PORCINE) IN NACL 2-0.9 UNIT/ML-% IJ SOLN
INTRAMUSCULAR | Status: DC | PRN
  Administered 2017-09-04: 13:00:00 via INTRA_ARTERIAL

## 2017-09-04 MED ORDER — LIDOCAINE HCL (PF) 1 % IJ SOLN
INTRAMUSCULAR | Status: DC | PRN
  Administered 2017-09-04: 2 mL

## 2017-09-04 SURGICAL SUPPLY — 11 items
CATH OPTITORQUE JACKY 4.0 5F (CATHETERS) ×2 IMPLANT
DEVICE RAD COMP TR BAND LRG (VASCULAR PRODUCTS) ×2 IMPLANT
ELECT DEFIB PAD ADLT CADENCE (PAD) ×2 IMPLANT
GLIDESHEATH SLEND SS 6F .021 (SHEATH) ×2 IMPLANT
GUIDEWIRE INQWIRE 1.5J.035X260 (WIRE) ×1 IMPLANT
HOVERMATT SINGLE USE (MISCELLANEOUS) ×2 IMPLANT
INQWIRE 1.5J .035X260CM (WIRE) ×2
KIT HEART LEFT (KITS) ×2 IMPLANT
PACK CARDIAC CATHETERIZATION (CUSTOM PROCEDURE TRAY) ×2 IMPLANT
TRANSDUCER W/STOPCOCK (MISCELLANEOUS) ×2 IMPLANT
TUBING CIL FLEX 10 FLL-RA (TUBING) ×2 IMPLANT

## 2017-09-04 NOTE — Progress Notes (Signed)
Report called and given to staff on [facility].

## 2017-09-04 NOTE — Progress Notes (Signed)
PROGRESS NOTE                                                                                                                                                                                                             Patient Demographics:    Brett Harper, is a 47 y.o. male, DOB - 1970/10/27, VWU:[id number]  Admit date - 09/03/2017   Admitting Physician [name], MD  Outpatient Primary MD for the patient is [name], [name], MD  LOS - 0  Outpatient Specialists: None  Chief Complaint  Patient presents with  . Chest Pain       Brief Narrative   47 year old morbidly obese male with history of aortic aneurysm, hypertension, history of NSTEMI  post cardiac catheter 2015, treated with DES to LAD and PDA, OSA and pulmonary sarcoidosis.  Patient was on effient post-cath that was stopped due to allergy.  He has not followed up with Dr. [name] since 02/2014.  Patient nonadherent to diet but reports taking his medications as instructed.  He presented to the ED with left-sided chest pain radiating to the left arm.  serial troponins have been negative and EKG unremarkable.  However given his persistent symptoms patient suspected to have unstable angina and started on heparin drip and being transferred to [facility] for cardiac cath.   Subjective:    still has off-and-on left-sided chest pain.   Assessment  & Plan :   Principal problem Unstable angina (HCC) Started on IV heparin drip.  Continue full dose aspirin, Nitropaste.  Follow 2D echo results.  Cardiology consult appreciated, transferred to [facility] for cardiac cath.  Beta-blocker held due to short pauses on telemetry overnight.  Check LDL in a.m.   Morbid obesity Counseled on weight loss, diet adherence and exercise  Ascending aortic aneurysm CT angiogram negative for dissection and showed aneurysm  measuring 3.9 cm.  Recommend annual follow-up with CTA or  MRA.      Code Status : Full code  Family Communication  : Wife at bedside  Disposition Plan  : Transfer to [name] for cardiac cath  Barriers For Discharge : Active symptoms  Consults  : Cardiology  Procedures  : Echo  DVT Prophylaxis  : IV heparin  Lab Results  Component Value Date   PLT 279 09/03/2017    Antibiotics  :  Anti-infectives (From admission, onward)   None        Objective:   Vitals:   09/03/17 2226 09/04/17 0100 09/04/17 0435 09/04/17 1000  BP: 140/87 133/78 129/89 121/79  Pulse: 80   62  Resp:   18 18  Temp: (!) 97.4 F (36.3 C) (!) 97.3 F (36.3 C) (!) 97.4 F (36.3 C) 98.2 F (36.8 C)  TempSrc: Oral Oral Oral Oral  SpO2: 94% 100% 99% 98%  Weight:   (!) 146.2 kg (322 lb 5 oz)   Height:   5\' 11"  (1.803 m)     Wt Readings from Last 3 Encounters:  09/04/17 (!) 146.2 kg (322 lb 5 oz)  11/06/16 (!) 145.2 kg (320 lb)  10/24/16 (!) 140.6 kg (310 lb)    No intake or output data in the 24 hours ending 09/04/17 1030   Physical Exam  Gen: Morbidly obese male, not in distress HEENT: no pallor, moist mucosa, supple neck Chest: clear b/l, no added sounds CVS: N S1&S2, no murmurs, rubs or gallop GI: soft, NT, ND, BS+ Musculoskeletal: warm, no edema     Data Review:    CBC Recent Labs  Lab 09/03/17 1713  WBC 10.4  HGB 13.4  HCT 40.7  PLT 279  MCV 89.3  MCH 29.4  MCHC 32.9  RDW 12.4  LYMPHSABS 2.9  MONOABS 1.1*  EOSABS 0.2  BASOSABS 0.0    Chemistries  Recent Labs  Lab 09/03/17 1713  NA 142  K 3.6  CL 107  CO2 22  GLUCOSE 99  BUN 14  CREATININE 1.04  CALCIUM 9.1   ------------------------------------------------------------------------------------------------------------------ No results for input(s): CHOL, HDL, LDLCALC, TRIG, CHOLHDL, LDLDIRECT in the last 72 hours.  Lab Results  Component Value Date   HGBA1C 5.6 02/02/2014    ------------------------------------------------------------------------------------------------------------------ No results for input(s): TSH, T4TOTAL, T3FREE, THYROIDAB in the last 72 hours.  Invalid input(s): FREET3 ------------------------------------------------------------------------------------------------------------------ No results for input(s): VITAMINB12, FOLATE, FERRITIN, TIBC, IRON, RETICCTPCT in the last 72 hours.  Coagulation profile No results for input(s): INR, PROTIME in the last 168 hours.  No results for input(s): DDIMER in the last 72 hours.  Cardiac Enzymes Recent Labs  Lab 09/03/17 1713 09/03/17 2152 09/04/17 0400  TROPONINI <0.03 <0.03 <0.03   ------------------------------------------------------------------------------------------------------------------ No results found for: BNP  Inpatient Medications  Scheduled Meds: . aspirin EC  325 mg Oral Daily  . nitroGLYCERIN  1 inch Topical Q6H  . pantoprazole (PROTONIX) IV  40 mg Intravenous Q12H   Continuous Infusions: . sodium chloride 100 mL/hr at 09/04/17 0022  . heparin 1,300 Units/hr (09/04/17 0922)   PRN Meds:.acetaminophen, ALPRAZolam, hydrALAZINE, nitroGLYCERIN, ondansetron (ZOFRAN) IV, zolpidem  Micro Results No results found for this or any previous visit (from the past 240 hour(s)).  Radiology Reports Dg Chest 2 View  Result Date: 09/03/2017 CLINICAL DATA:  Shortness of breath EXAM: CHEST  2 VIEW COMPARISON:  October 24 2016 FINDINGS: The heart size and mediastinal contours are stable. The heart size is enlarged. The lung volumes are low. Both lungs are clear. The visualized skeletal structures are unremarkable. IMPRESSION: No active cardiopulmonary disease. Electronically Signed   By: Sherian ReinWei-Chen  Lin M.D.   On: 09/03/2017 18:29   Ct Angio Chest/abd/pel For Dissection W And/or W/wo  Result Date: 09/03/2017 CLINICAL DATA:  Patient reports chest pain that started yesterday. Patient reports  nausea, weakness and SOB. Patient states his pain has become worse today; hx ascending aortic aneurysm; sarcoidosis^11600mL ISOVUE-370 IOPAMIDOL (ISOVUE-370) INJECTION 76% EXAM: CT ANGIOGRAPHY  CHEST, ABDOMEN AND PELVIS TECHNIQUE: Multidetector CT imaging through the chest, abdomen and pelvis was performed using the standard protocol during bolus administration of intravenous contrast. Multiplanar reconstructed images and MIPs were obtained and reviewed to evaluate the vascular anatomy. CONTRAST:  ISOVUE-370 IOPAMIDOL (ISOVUE-370) INJECTION 76% COMPARISON:  CTA chest dated 10/24/2016. CT abdomen dated 11/30/2014. FINDINGS: CTA CHEST FINDINGS Cardiovascular: Ascending thoracic aorta measures 3.9 cm diameter. No thoracic aortic dissection. Heart size is normal. No pericardial effusion. Coronary artery calcifications versus stent within the left anterior descending coronary artery. Mediastinum/Nodes: No mass or enlarged lymph nodes seen within the mediastinum or perihilar regions. Scattered small lymph nodes. Esophagus appears normal. Trachea and central bronchi are unremarkable. Lungs/Pleura: Mild atelectasis at the left lung base. Lungs otherwise clear. Musculoskeletal: Mild degenerative change within the thoracic spine. No acute or suspicious osseous finding. Review of the MIP images confirms the above findings. CTA ABDOMEN AND PELVIS FINDINGS VASCULAR Aorta: Normal caliber aorta without aneurysm, dissection, vasculitis or significant stenosis. Celiac: Patent without evidence of aneurysm, dissection, vasculitis or significant stenosis. SMA: Patent without evidence of aneurysm, dissection, vasculitis or significant stenosis. Renals: Both renal arteries are patent without evidence of aneurysm, dissection, vasculitis, fibromuscular dysplasia or significant stenosis. IMA: Patent without evidence of aneurysm, dissection, vasculitis or significant stenosis. Inflow: Patent without evidence of aneurysm, dissection,  vasculitis or significant stenosis. Veins: No obvious venous abnormality within the limitations of this arterial phase study. Review of the MIP images confirms the above findings. NON-VASCULAR Hepatobiliary: No focal liver abnormality is seen. No gallstones, gallbladder wall thickening, or biliary dilatation. Pancreas: Unremarkable. No pancreatic ductal dilatation or surrounding inflammatory changes. Spleen: Normal in size without focal abnormality. Adrenals/Urinary Tract: Adrenal glands appear normal. Kidneys are unremarkable without mass, stone or hydronephrosis. No ureteral or bladder calculi identified bilaterally. Bladder appears normal. Stomach/Bowel: Bowel is normal in caliber. No bowel wall thickening or evidence of bowel wall inflammation seen. Appendix appears normal. Stomach is unremarkable. Lymphatic: No enlarged lymph nodes seen. Reproductive: Prostate is unremarkable. Other: No free fluid or abscess collection. No free intraperitoneal air. Musculoskeletal: Mild degenerative change within the lumbar spine. No acute or suspicious osseous finding. Review of the MIP images confirms the above findings. IMPRESSION: 1. No acute findings within the chest, abdomen or pelvis. No aortic dissection. 2. Ascending thoracic aorta measures 3.9 cm diameter on today's study. Recommend annual imaging followup by CTA or MRA. This recommendation follows 2010 ACCF/AHA/AATS/ACR/ASA/SCA/SCAI/SIR/STS/SVM Guidelines for the Diagnosis and Management of Patients with Thoracic Aortic Disease. Circulation.2010; 121: Z610-R604 3. Coronary artery calcifications versus stent within the LAD. Heart size is normal. No pericardial effusion. Electronically Signed   By: Bary Richard M.D.   On: 09/03/2017 21:06    Time Spent in minutes  25   Aireonna Bauer M.D on 09/04/2017 at 10:30 AM  Between 7am to 7pm - Pager - [phone]  After 7pm go to [email or web address] - password [facility]  [facility] -  Office  [phone]

## 2017-09-04 NOTE — Progress Notes (Signed)
D-   Report of 2.02 second pause and a run of 2nd degree type 2 heart block.  Upon assessment patient is alert and vitals remain stable.  He denies change in symptoms and reports chest pain has decreased but not resolved.  Chest pain is increased with deep breathing and applied pressure.    A-  Dr. Robb Matarrtiz notified via text page of the above and new orders were placed in the [facility] and carried out as ordered.  R-    Patient in bed and reports improvement in pain after Toradol given as ordered.  Telemetry shows NSR rate 60.  Nursing staff to continue to monitor

## 2017-09-04 NOTE — Interval H&P Note (Signed)
History and Physical Interval Note:  09/04/2017 1:05 PM  Brett LibraWilliam Kirtz  has presented today for surgery, with the diagnosis of cp  The various methods of treatment have been discussed with the patient and family. After consideration of risks, benefits and other options for treatment, the patient has consented to  Procedure(s): LEFT HEART CATH AND CORONARY ANGIOGRAPHY (N/A) as a surgical intervention .  The patient's history has been reviewed, patient examined, no change in status, stable for surgery.  I have reviewed the patient's chart and labs.  Questions were answered to the patient's satisfaction.     Lorine BearsMuhammad Markesha Hannig

## 2017-09-04 NOTE — Research (Signed)
OPTIMIZE Informed Consent   Subject Name: Brett Harper  Subject met inclusion and exclusion criteria.  The informed consent form, study requirements and expectations were reviewed with the subject and questions and concerns were addressed prior to the signing of the consent form.  The subject verbalized understanding of the trial requirements.  The subject agreed to participate in the OPTIMIZE trial and signed the informed consent 1240 on 09/04/2017.  The informed consent was obtained prior to performance of any protocol-specific procedures for the subject.  A copy of the signed informed consent was given to the subject and a copy was placed in the subject's medical record. The subject will only be enrolled if the angiographic criteria is met.  [name] 09/04/2017, 4:40 PM

## 2017-09-04 NOTE — Consult Note (Addendum)
Cardiology Consultation:   Patient ID: Brett Harper; [id number]; [birth year]   Admit date: 09/03/2017 Date of Consult: 09/04/2017  Primary Care Provider: Estanislado Pandy, MD Primary Cardiologist: Dina Rich, MD     Patient Profile:   Brett Harper is a 47 y.o. male with a hx of CAD who is being seen today for the evaluation of chest pain at the request of Dhungel.  History of Present Illness:   Brett Harper is a 47 year old male patient who had an NSTEMI 02/03/14 treated with DES to the LAD with jailing of the second diagonal but still with TIMI-3 flow.  He had staged PCI with DES to the PDA the following day.  2D echo showed normal LVEF 55-60% with grade 1 DD in 04/2014 patient also has hyperlipidemia, OSA and pulmonary sarcoidosis.  Patient last saw Dr. [name] in 02/2014 at which time he said he was allergic to Effient and stopped it.  He never followed up.  In the ER 10/2016 for chest pain and CT was negative for PE but he had mild aneurysmal dilatation of the ascending aorta up to 4.1 cm.  Annual screening recommended.  Patient presents yesterday with prolonged chest pain radiating to his left arm.  CTA showed no pulmonary embolus, ascending thoracic aorta was 3.9 cm.  Troponins negative x3, other labs unremarkable.  EKG normal sinus rhythm without acute change.  Patient says his BP has been running high in the last week. Takes metoprolol. Started having chest tightness into left arm with dyspnea yesterday some relief with 3 NTG but better with Toradol. Had a lot of pain through the night, better now after tordadol.  Past Medical History:  Diagnosis Date  . CAD (coronary artery disease)    DES to LAD and staged DES to PDA June 2015  . Essential hypertension   . Gout   . NSTEMI (non-ST elevated myocardial infarction) [facility])    June 2015  . Obesity   . Pulmonary sarcoidosis (HCC)    a. 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 INGUINAL HERNIA REPAIR    . LEFT HEART CATHETERIZATION WITH CORONARY ANGIOGRAM N/A 02/03/2014   Procedure: LEFT HEART CATHETERIZATION WITH CORONARY ANGIOGRAM;  Surgeon: Iran Ouch, MD;  Location: [facility] CATH LAB;  Service: Cardiovascular;  Laterality: N/A;  . LEFT HEART CATHETERIZATION WITH CORONARY ANGIOGRAM N/A 02/04/2014   Procedure: LEFT HEART CATHETERIZATION WITH CORONARY ANGIOGRAM;  Surgeon: Kathleene Hazel, MD;  Location: [facility] C[facility] LAB;  Service: Cardiovascular;  Laterality: N/A;     Home Medications:  Prior to Admission medications   Medication Sig Start Date End Date Taking? Authorizing Provider  aspirin EC 81 MG EC tablet Take 1 tablet (81 mg total) by mouth daily. 02/05/14  Yes Barrett, Joline Salt, PA-C  meloxicam (MOBIC) 15 MG tablet Take 15 mg by mouth daily.   Yes [provider]  metoprolol tartrate (LOPRESSOR) 25 MG tablet Take 25 mg by mouth 2 (two) times daily.   Yes [provider]  nitroGLYCERIN (NITROSTAT) 0.4 MG SL tablet Place 1 tablet (0.4 mg total) under the tongue every 5 (five) minutes as needed for chest pain. 02/05/14  Yes Barrett, Joline Salt, PA-C  omeprazole (PRILOSEC OTC) 20 MG tablet Take 20 mg by mouth daily.    Yes [provider]    Inpatient Medications: Scheduled Meds: . aspirin EC  325 mg Oral Daily  . nitroGLYCERIN  1 inch Topical Q6H  . pantoprazole (PROTONIX) IV  40 mg Intravenous Q12H   Continuous Infusions: . sodium chloride 100 mL/hr at 09/04/17 0022  . heparin 1,300 Units/hr (09/04/17 0922)   PRN Meds: acetaminophen, ALPRAZolam, hydrALAZINE, nitroGLYCERIN, ondansetron (ZOFRAN) IV, zolpidem  Allergies:    Allergies  Allergen Reactions  . Naproxen Other (See Comments)    bleeding    Social History:   Social History   Socioeconomic History  . Marital status: Married    Spouse name: Not on file  . Number of children: Not on file  . Years of education: Not on  file  . Highest education level: Not on file  Social Needs  . Financial resource strain: Not on file  . Food insecurity - worry: Not on file  . Food insecurity - inability: Not on file  . Transportation needs - medical: Not on file  . Transportation needs - non-medical: Not on file  Occupational History  . Not on file  Tobacco Use  . Smoking status: Never Smoker  . Smokeless tobacco: Never Used  Substance and Sexual Activity  . Alcohol use: No  . Drug use: No  . Sexual activity: Not on file  Other Topics Concern  . Not on file  Social History Narrative  . Not on file    Family History:    Family History  Problem Relation Age of Onset  . Hypertension Mother   . Heart attack Father   . Diabetes Father   . Stroke Father   . Heart attack Maternal Grandfather   . Heart attack Paternal Grandfather   . Heart attack Cousin         stent placed in 40s     ROS:  Please see the history of present illness.  Review of Systems  HENT: Negative.   Eyes: Negative.   Respiratory: Negative.   Cardiovascular: Positive for chest pain and leg swelling.  Gastrointestinal: Negative.   Genitourinary: Negative.   Musculoskeletal: Negative.   Neurological: Positive for weakness.  Endo/Heme/Allergies: Negative.   All other systems reviewed and are negative.   All other ROS reviewed and negative.     Physical Exam/Data:   Vitals:   09/03/17 2000 09/03/17 2226 09/04/17 0100 09/04/17 0435  BP: (!) 138/93 140/87 133/78 129/89  Pulse: 100 80    Resp: 19   18  Temp:  (!) 97.4 F (36.3 C) (!) 97.3 F (36.3 C) (!) 97.4 F (36.3 C)  TempSrc:  Oral Oral Oral  SpO2: 94% 94% 100% 99%  Weight:    (!) 322 lb 5 oz (146.2 kg)  Height:    5\' 11"  (1.803 m)   No intake or output data in the 24 hours ending 09/04/17 0944 Filed Weights   09/03/17 1651 09/04/17 0435  Weight: (!) 320 lb (145.2 kg) (!) 322 lb 5 oz (146.2 kg)   Body mass index is 44.95 kg/m.  General:  Obese, in no acute  distress  HEENT: normal Lymph: no adenopathy Neck: no JVD Endocrine:  No thryomegaly Vascular: No carotid bruits; FA pulses 2+ bilaterally without bruits  Cardiac:  normal S1, S2; RRR; no murmur distant Heart sounds Lungs:  clear to auscultation bilaterally, no wheezing, rhonchi or rales  Abd: soft, nontender, no hepatomegaly  Ext: no edema Musculoskeletal:  No deformities, BUE and BLE strength normal and equal Skin: warm and dry  Neuro:  CNs 2-12 intact, no focal abnormalities noted Psych:  Normal affect   EKG:  The  EKG was personally reviewed and demonstrates: Normal sinus rhythm no acute change Telemetry:  Telemetry was personally reviewed and demonstrates:  NSR  Relevant CV Studies:  CT 09/03/17 IMPRESSION: 1. No acute findings within the chest, abdomen or pelvis. No aortic dissection. 2. Ascending thoracic aorta measures 3.9 cm diameter on today's study. Recommend annual imaging followup by CTA or MRA. This recommendation follows 2010 ACCF/AHA/AATS/ACR/ASA/SCA/SCAI/SIR/STS/SVM Guidelines for the Diagnosis and Management of Patients with Thoracic Aortic Disease. Circulation.2010; 121: [id number] 3. Coronary artery calcifications versus stent within the LAD. Heart size is normal. No pericardial effusion.   Electronically Signed   By: Bary RichardStan  Maynard M.D.   On: 09/03/2017 21:06  Echocardiogram 04/14/2014 Study Conclusions  - Left ventricle: The cavity size was normal. Wall thickness was   increased in a pattern of mild LVH. Systolic function was normal.   The estimated ejection fraction was in the range of 55% to 60%.   Images were inadequate for LV wall motion assessment, as the   endocardial borders were inadequately visualized. However, there   was no gross regional variation. Doppler parameters are   consistent with abnormal left ventricular relaxation (grade 1   diastolic dysfunction). - Aortic valve: Mildly calcified annulus. Trileaflet. - Left atrium: The atrium  was mildly dilated.  Coronary angiography:  Coronary dominance: Right   Left Main: Normal   Left Anterior Descending (LAD): Normal in size and mildly calcified. There is a 95% stenosis in the midsegment after the first septal perforator followed by a diffuse area of 80% disease.   1st diagonal (D1): Normal in size with minor irregularities.   2nd diagonal (D2): Normal in size with 50% ostial stenosis   3rd diagonal (D3): Small in size with minor irregularities.   Circumflex (LCx): Normal in size and nondominant. There is 60% ostial stenosis. The rest of the vessel has minor irregularities.   1st obtuse marginal: Normal in size with no significant disease.   2nd obtuse marginal: Small in size with no significant disease   3rd obtuse marginal: Normal in size with no significant disease   Right Coronary Artery: The normal in size and dominant. There is 20% proximal stenosis.   Posterior descending artery: Large in size with 80-90% proximal stenosis   Posterior AV segment: Normal in size with no significant disease.   Posterolateral branchs: No significant disease Left ventriculography: Left ventricular systolic function is mildly reduced , LVEF is estimated at 45 %, there is no significant mitral regurgitation . Mild anterior and apical hypokinesis.  PCI Note: Following the diagnostic procedure, the decision was made to proceed with PCI. Weight-based bivalirudin was given for anticoagulation. Once a therapeutic ACT was achieved, a 6 [place] XB LAD 3.5 guide catheter was inserted. A Runthrough coronary guidewire was used to cross the lesion. The lesion was predilated with a 2.5 x 15 balloon. I attempted to advance the wire into the second diagonal to protect the vessel. However, there was difficulty with poor visualization. The lesion was then stented with a 2.75 x 33 mm Xience drug-eluting stent. The stent was postdilated with a 3.0 x 15 noncompliant balloon in the proximal segment.  Following PCI, there was 0% residual stenosis and TIMI-3 flow. The second diagonal was jailed by the stent with worsened ostial stenosis but TIMI 3 flow.. The patient tolerated the procedure well. There were no immediate procedural complications. A TR band was used for radial hemostasis. The patient was transferred to the post catheterization recovery area for further monitoring.  PCI  Data: I will  Vessel - LAD/Segment - mid  Percent Stenosis (pre) 95%  TIMI-flow 3  Stent : 2.75 x 33 mm Xience drug-eluting stent postdilated with a 3.0 noncompliant balloon  Percent Stenosis (post) 0%  TIMI-flow (post) 3  Final Conclusions:  1. Significant three-vessel coronary artery disease with 95% mid LAD stenosis, 60% ostial left circumflex stenosis and 80-90% proximal right PDA stenosis. The culprit the LAD.  2. Mildly reduced LV systolic function with an ejection fraction of 45% and mildly elevated left ventricular end-diastolic pressure.  3. Successful angioplasty and drug-eluting stent placement to the mid LAD. The second diagonal was jailed by the stent with worsened ostial stenosis but TIMI 3 flow. This was left to be treated medically.  Recommendations:  Dual antiplatelet therapy for at least one year. PCI of the right PDA can be staged. The patient had severe anxiety throughout the case. It was difficult to sedate him.  He was having chest pain at the beginning of the diagnostic catheterization and worsened before PCI. He continued to have chest pain even after the case. He was started on nitroglycerin drip.     Hemodynamic Findings:  Central aortic pressure: 125/86  Angiographic Findings:  Left main: No obstructive disease.  Left Anterior Descending Artery: Large caliber vessel that courses to the apex. The mid stented segment is patent without restenosis. The first diagonal is moderate in caliber and is patent without stenosis. The second diagonal branch is jailed by the LAD stent with ostial 70%  narrowing but excellent flow.  Circumflex Artery: Large caliber vessel with ostial 60% stenosis. The first obtuse marginal branch is moderate in caliber with diffuse 30% stenosis. The second obtuse marginal branch is a moderate caliber bifurcating vessel with mild plaque.  Right Coronary Artery: Large dominant vessel with 20% proximal and mid stenosis. The PDA is moderate caliber vessel with 90-95% stenosis.  Left Ventricular Angiogram: Deferred.  Impression:  1. Double vessel CAD with patent LAD stent, severe PDA stenosis  2. Ongoing chest pain  3. Successful PTCA/DES x 1 PDA  Recommendations: Will continue ASA/Effient for at least one year. Continue statin, beta blocker. Likely d/c home in am.  Complications: None. The patient tolerated the procedure well.     Laboratory Data:  Chemistry Recent Labs  Lab 09/03/17 1713  NA 142  K 3.6  CL 107  CO2 22  GLUCOSE 99  BUN 14  CREATININE 1.04  CALCIUM 9.1  GFRNONAA >60  GFRAA >60  ANIONGAP 13    No results for input(s): PROT, ALBUMIN, AST, ALT, ALKPHOS, BILITOT in the last 168 hours. Hematology Recent Labs  Lab 09/03/17 1713  WBC 10.4  RBC 4.56  HGB 13.4  HCT 40.7  MCV 89.3  MCH 29.4  MCHC 32.9  RDW 12.4  PLT 279   Cardiac Enzymes Recent Labs  Lab 09/03/17 1713 09/03/17 2152 09/04/17 0400  TROPONINI <0.03 <0.03 <0.03   No results for input(s): TROPIPOC in the last 168 hours.   Radiology/Studies:  Dg Chest 2 View  Result Date: 09/03/2017 CLINICAL DATA:  Shortness of breath EXAM: CHEST  2 VIEW COMPARISON:  October 24 2016 FINDINGS: The heart size and mediastinal contours are stable. The heart size is enlarged. The lung volumes are low. Both lungs are clear. The visualized skeletal structures are unremarkable. IMPRESSION: No active cardiopulmonary disease. Electronically Signed   By: Sherian Rein M.D.   On: 09/03/2017 18:29   Ct Angio Chest/abd/pel For Dissection W And/or W/wo  Result  Date: 09/03/2017 CLINICAL DATA:   Patient reports chest pain that started yesterday. Patient reports nausea, weakness and SOB. Patient states his pain has become worse today; hx ascending aortic aneurysm; sarcoidosis^149mL ISOVUE-370 IOPAMIDOL (ISOVUE-370) INJECTION 76% EXAM: CT ANGIOGRAPHY CHEST, ABDOMEN AND PELVIS TECHNIQUE: Multidetector CT imaging through the chest, abdomen and pelvis was performed using the standard protocol during bolus administration of intravenous contrast. Multiplanar reconstructed images and MIPs were obtained and reviewed to evaluate the vascular anatomy. CONTRAST:  ISOVUE-370 IOPAMIDOL (ISOVUE-370) INJECTION 76% COMPARISON:  CTA chest dated 10/24/2016. CT abdomen dated 11/30/2014. FINDINGS: CTA CHEST FINDINGS Cardiovascular: Ascending thoracic aorta measures 3.9 cm diameter. No thoracic aortic dissection. Heart size is normal. No pericardial effusion. Coronary artery calcifications versus stent within the left anterior descending coronary artery. Mediastinum/Nodes: No mass or enlarged lymph nodes seen within the mediastinum or perihilar regions. Scattered small lymph nodes. Esophagus appears normal. Trachea and central bronchi are unremarkable. Lungs/Pleura: Mild atelectasis at the left lung base. Lungs otherwise clear. Musculoskeletal: Mild degenerative change within the thoracic spine. No acute or suspicious osseous finding. Review of the MIP images confirms the above findings. CTA ABDOMEN AND PELVIS FINDINGS VASCULAR Aorta: Normal caliber aorta without aneurysm, dissection, vasculitis or significant stenosis. Celiac: Patent without evidence of aneurysm, dissection, vasculitis or significant stenosis. SMA: Patent without evidence of aneurysm, dissection, vasculitis or significant stenosis. Renals: Both renal arteries are patent without evidence of aneurysm, dissection, vasculitis, fibromuscular dysplasia or significant stenosis. IMA: Patent without evidence of aneurysm, dissection, vasculitis or significant  stenosis. Inflow: Patent without evidence of aneurysm, dissection, vasculitis or significant stenosis. Veins: No obvious venous abnormality within the limitations of this arterial phase study. Review of the MIP images confirms the above findings. NON-VASCULAR Hepatobiliary: No focal liver abnormality is seen. No gallstones, gallbladder wall thickening, or biliary dilatation. Pancreas: Unremarkable. No pancreatic ductal dilatation or surrounding inflammatory changes. Spleen: Normal in size without focal abnormality. Adrenals/Urinary Tract: Adrenal glands appear normal. Kidneys are unremarkable without mass, stone or hydronephrosis. No ureteral or bladder calculi identified bilaterally. Bladder appears normal. Stomach/Bowel: Bowel is normal in caliber. No bowel wall thickening or evidence of bowel wall inflammation seen. Appendix appears normal. Stomach is unremarkable. Lymphatic: No enlarged lymph nodes seen. Reproductive: Prostate is unremarkable. Other: No free fluid or abscess collection. No free intraperitoneal air. Musculoskeletal: Mild degenerative change within the lumbar spine. No acute or suspicious osseous finding. Review of the MIP images confirms the above findings. IMPRESSION: 1. No acute findings within the chest, abdomen or pelvis. No aortic dissection. 2. Ascending thoracic aorta measures 3.9 cm diameter on today's study. Recommend annual imaging followup by CTA or MRA. This recommendation follows 2010 ACCF/AHA/AATS/ACR/ASA/SCA/SCAI/SIR/STS/SVM Guidelines for the Diagnosis and Management of Patients with Thoracic Aortic Disease. Circulation.2010; 121: Z610-R604 3. Coronary artery calcifications versus stent within the LAD. Heart size is normal. No pericardial effusion. Electronically Signed   By: Bary Richard M.D.   On: 09/03/2017 21:06    Assessment and Plan:   1.Chest pain troponins negative x3 EKG without acute change. Symptoms worrisome for angina. Will start IV heparin. Suspect he needs  cath. Long talk about compliance if he needs a stent. He says he'll be compliant.Agreeable to cath.I have reviewed the risks, indications, and alternatives to angioplasty and stenting with the patient. Risks include but are not limited to bleeding, infection, vascular injury, stroke, myocardial infection, arrhythmia, kidney injury, radiation-related injury in the case of prolonged fluoroscopy use, emergency cardiac surgery, and death. The patient understands the risks of serious  complication is low (<1%) and he agrees to proceed.   2.CAD status post NSTEMI 02/03/14 treated with DES to the LAD with jailing of D2 but TIMI-3 flow, staged DES to the PDA 02/04/14 with residual 50% D2, 60% ostial circumflex, LVEF 45% on LV gram but improved to 50-55% on echo 04/2014.  Noncompliance with Effient and Plavix and no follow-up since then. 3.Pulmonary sarcoidosis 4.Hyperlipidemia 5.Ascending thoracic aortic aneurysm 3.9 cm on CT 6.HTN 7.Obesity   For questions or updates, please contact [facility] Please consult [email or web address] for contact info under Cardiology/STEMI.   Elson Clan, PA-C 09/04/2017 9:44 AM   Attending note:  Patient seen and examined.  I reviewed previous records and updated the chart, also discussed the case and plan with Ms. Geni Bers PA-C.  Mr. Oestreicher has a history of pulmonary sarcoidosis based on previous biopsy (reportedly stable without active symptoms), hyperlipidemia, mild aneurysmal dilatation of the ascending thoracic aorta, essential hypertension, morbid obesity, and CAD status post NSTEMI in July 2015 managed with DES to the LAD and staged DES to the PDA.  He was not compliant with dual antiplatelet therapy following intervention and has had no regular cardiology follow-up since then.  He presents now reporting elevations in blood pressure on antihypertensive therapy within the last week, onset of chest tightness and shortness of breath within the last 24 hours, symptoms have  been waxing and waning including overnight during hospital observation.  Initial troponin I levels are negative for ACS, ECG nonspecific.  He has had no recent cough or hemoptysis.  Chest CTA showed no obvious acute findings in the lungs with stable mild dilatation of the thoracic aorta.  On examination this morning he continues to complain of mild chest discomfort, has been treated with Toradol and also placed on heparin.  Heart rate is in the 70s-80s in sinus rhythm by telemetry which I personally reviewed.  Systolic blood pressure in the 120s-140s.  Lungs are clear without labored breathing.  Cardiac exam reveals RRR without gallop.  Troponin I levels are less than 0.03, BUN 14 and creatinine 1.0.  Hemoglobin 13.4 with normal platelets.  I personally reviewed his ECG which shows sinus rhythm with nonspecific ST changes.  Patient presents with recent onset chest tightness concerning for unstable angina although ECG is nonspecific and troponin I levels are negative.  He has a history of CAD status post DES intervention to the LAD and PDA back in 2015, was not compliant with dual antiplatelet regimen at that point.  We discussed transfer to [facility] in anticipation of a diagnostic cardiac catheterization.  After reviewing the risks and benefits he is in agreement to proceed.  Also reinforced compliance with medical therapy if he were to undergo repeat coronary intervention.  Jonelle Sidle, M.D., F.A.C.C.

## 2017-09-04 NOTE — Progress Notes (Signed)
Night shift floor coverage note.  Telemetry reported at 1.9-second pause, followed by a few seconds of type II second-degree heart block.  Then the patient went back to normal sinus rhythm.  He was sleeping when this happened. Metoprolol and morphine held. Toradol 30 mg single dose ordered for pleuritic chest pain.  Sanda Kleinavid Ortiz, MD

## 2017-09-04 NOTE — Progress Notes (Signed)
TR BAND REMOVAL  LOCATION:    right radial  DEFLATED PER PROTOCOL:    Yes.    TIME BAND OFF / DRESSING APPLIED:    1700   SITE UPON ARRIVAL:    Level 0  SITE AFTER BAND REMOVAL:    Level 0  CIRCULATION SENSATION AND MOVEMENT:    Within Normal Limits   Yes.    COMMENTS:  Pt.tolerated well.site level 0;with palpable radial pulses.Dsd.applied .

## 2017-09-04 NOTE — Progress Notes (Signed)
ANTICOAGULATION CONSULT NOTE - Initial Consult  Pharmacy Consult for Heparin Indication: chest pain/ACS  Allergies  Allergen Reactions  . Naproxen Other (See Comments)    bleeding    Patient Measurements: Height: 5\' 11"  (180.3 cm) Weight: (!) 322 lb 5 oz (146.2 kg) IBW/kg (Calculated) : 75.3 HEPARIN DW (KG): 109.7  Vital Signs: Temp: 97.4 F (36.3 C) (01/30 0435) Temp Source: Oral (01/30 0435) BP: 129/89 (01/30 0435) Pulse Rate: 80 (01/29 2226)  Labs: Recent Labs    09/03/17 1713 09/03/17 2152 09/04/17 0400  HGB 13.4  --   --   HCT 40.7  --   --   PLT 279  --   --   CREATININE 1.04  --   --   TROPONINI <0.03 <0.03 <0.03    Estimated Creatinine Clearance: 127.4 mL/min (by C-G formula based on SCr of 1.04 mg/dL).   Medical History: Past Medical History:  Diagnosis Date  . Gout   . H/O inguinal hernia repair   . Hypertension   . MI, old   . Obesity   . Pulmonary sarcoidosis (HCC)    a. biopsy confirmed; s/p left testicle biopsy 2004 which showed non-caseating granuloma 2004    Medications:  See med rec  Assessment: 47 yo male presented to ED with Chest pain. Troponins negative x3 EKG without acute change. Symptoms worrisome for angina. Cardiology suspects will need cath and is starting IV heparin.   Goal of Therapy:  Heparin level 0.3-0.7 units/ml Monitor platelets by anticoagulation protocol: Yes   Plan:  Give 4000 units bolus x 1 Start heparin infusion at 1300 units/hr Check anti-Xa level in 6-8 hours and daily while on heparin Continue to monitor H&H and platelets  Elder CyphersLorie Sherri Levenhagen, BS Loura BackPharm D, BCPS Clinical Pharmacist Pager [phone] 09/04/2017,8:43 AM

## 2017-09-04 NOTE — Progress Notes (Signed)
*  PRELIMINARY RESULTS* Echocardiogram 2D Echocardiogram has been performed.  Brett Harper, Brett Harper J 09/04/2017, 11:16 AM

## 2017-09-04 NOTE — H&P (View-Only) (Signed)
Cardiology Consultation:   Patient ID: Brett Harper; [id number]; [birth year]   Admit date: 09/03/2017 Date of Consult: 09/04/2017  Primary Care Provider: Estanislado Pandy, MD Primary Cardiologist: Dina Rich, MD     Patient Profile:   Brett Harper is a 47 y.o. male with a hx of CAD who is being seen today for the evaluation of chest pain at the request of Dhungel.  History of Present Illness:   Brett Harper is a 47 year old male patient who had an NSTEMI 02/03/14 treated with DES to the LAD with jailing of the second diagonal but still with TIMI-3 flow.  He had staged PCI with DES to the PDA the following day.  2D echo showed normal LVEF 55-60% with grade 1 DD in 04/2014 patient also has hyperlipidemia, OSA and pulmonary sarcoidosis.  Patient last saw Dr. [name] in 02/2014 at which time he said he was allergic to Effient and stopped it.  He never followed up.  In the ER 10/2016 for chest pain and CT was negative for PE but he had mild aneurysmal dilatation of the ascending aorta up to 4.1 cm.  Annual screening recommended.  Patient presents yesterday with prolonged chest pain radiating to his left arm.  CTA showed no pulmonary embolus, ascending thoracic aorta was 3.9 cm.  Troponins negative x3, other labs unremarkable.  EKG normal sinus rhythm without acute change.  Patient says his BP has been running high in the last week. Takes metoprolol. Started having chest tightness into left arm with dyspnea yesterday some relief with 3 NTG but better with Toradol. Had a lot of pain through the night, better now after tordadol.  Past Medical History:  Diagnosis Date  . CAD (coronary artery disease)    DES to LAD and staged DES to PDA June 2015  . Essential hypertension   . Gout   . NSTEMI (non-ST elevated myocardial infarction) [facility])    June 2015  . Obesity   . Pulmonary sarcoidosis (HCC)    a. 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 INGUINAL HERNIA REPAIR    . LEFT HEART CATHETERIZATION WITH CORONARY ANGIOGRAM N/A 02/03/2014   Procedure: LEFT HEART CATHETERIZATION WITH CORONARY ANGIOGRAM;  Surgeon: Iran Ouch, MD;  Location: [facility] CATH LAB;  Service: Cardiovascular;  Laterality: N/A;  . LEFT HEART CATHETERIZATION WITH CORONARY ANGIOGRAM N/A 02/04/2014   Procedure: LEFT HEART CATHETERIZATION WITH CORONARY ANGIOGRAM;  Surgeon: Kathleene Hazel, MD;  Location: [facility] C[facility] LAB;  Service: Cardiovascular;  Laterality: N/A;     Home Medications:  Prior to Admission medications   Medication Sig Start Date End Date Taking? Authorizing Provider  aspirin EC 81 MG EC tablet Take 1 tablet (81 mg total) by mouth daily. 02/05/14  Yes Barrett, Joline Salt, PA-C  meloxicam (MOBIC) 15 MG tablet Take 15 mg by mouth daily.   Yes [provider]  metoprolol tartrate (LOPRESSOR) 25 MG tablet Take 25 mg by mouth 2 (two) times daily.   Yes [provider]  nitroGLYCERIN (NITROSTAT) 0.4 MG SL tablet Place 1 tablet (0.4 mg total) under the tongue every 5 (five) minutes as needed for chest pain. 02/05/14  Yes Barrett, Joline Salt, PA-C  omeprazole (PRILOSEC OTC) 20 MG tablet Take 20 mg by mouth daily.    Yes [provider]    Inpatient Medications: Scheduled Meds: . aspirin EC  325 mg Oral Daily  . nitroGLYCERIN  1 inch Topical Q6H  . pantoprazole (PROTONIX) IV  40 mg Intravenous Q12H   Continuous Infusions: . sodium chloride 100 mL/hr at 09/04/17 0022  . heparin 1,300 Units/hr (09/04/17 0922)   PRN Meds: acetaminophen, ALPRAZolam, hydrALAZINE, nitroGLYCERIN, ondansetron (ZOFRAN) IV, zolpidem  Allergies:    Allergies  Allergen Reactions  . Naproxen Other (See Comments)    bleeding    Social History:   Social History   Socioeconomic History  . Marital status: Married    Spouse name: Not on file  . Number of children: Not on file  . Years of education: Not on  file  . Highest education level: Not on file  Social Needs  . Financial resource strain: Not on file  . Food insecurity - worry: Not on file  . Food insecurity - inability: Not on file  . Transportation needs - medical: Not on file  . Transportation needs - non-medical: Not on file  Occupational History  . Not on file  Tobacco Use  . Smoking status: Never Smoker  . Smokeless tobacco: Never Used  Substance and Sexual Activity  . Alcohol use: No  . Drug use: No  . Sexual activity: Not on file  Other Topics Concern  . Not on file  Social History Narrative  . Not on file    Family History:    Family History  Problem Relation Age of Onset  . Hypertension Mother   . Heart attack Father   . Diabetes Father   . Stroke Father   . Heart attack Maternal Grandfather   . Heart attack Paternal Grandfather   . Heart attack Cousin         stent placed in 40s     ROS:  Please see the history of present illness.  Review of Systems  HENT: Negative.   Eyes: Negative.   Respiratory: Negative.   Cardiovascular: Positive for chest pain and leg swelling.  Gastrointestinal: Negative.   Genitourinary: Negative.   Musculoskeletal: Negative.   Neurological: Positive for weakness.  Endo/Heme/Allergies: Negative.   All other systems reviewed and are negative.   All other ROS reviewed and negative.     Physical Exam/Data:   Vitals:   09/03/17 2000 09/03/17 2226 09/04/17 0100 09/04/17 0435  BP: (!) 138/93 140/87 133/78 129/89  Pulse: 100 80    Resp: 19   18  Temp:  (!) 97.4 F (36.3 C) (!) 97.3 F (36.3 C) (!) 97.4 F (36.3 C)  TempSrc:  Oral Oral Oral  SpO2: 94% 94% 100% 99%  Weight:    (!) 322 lb 5 oz (146.2 kg)  Height:    5\' 11"  (1.803 m)   No intake or output data in the 24 hours ending 09/04/17 0944 Filed Weights   09/03/17 1651 09/04/17 0435  Weight: (!) 320 lb (145.2 kg) (!) 322 lb 5 oz (146.2 kg)   Body mass index is 44.95 kg/m.  General:  Obese, in no acute  distress  HEENT: normal Lymph: no adenopathy Neck: no JVD Endocrine:  No thryomegaly Vascular: No carotid bruits; FA pulses 2+ bilaterally without bruits  Cardiac:  normal S1, S2; RRR; no murmur distant Heart sounds Lungs:  clear to auscultation bilaterally, no wheezing, rhonchi or rales  Abd: soft, nontender, no hepatomegaly  Ext: no edema Musculoskeletal:  No deformities, BUE and BLE strength normal and equal Skin: warm and dry  Neuro:  CNs 2-12 intact, no focal abnormalities noted Psych:  Normal affect   EKG:  The  EKG was personally reviewed and demonstrates: Normal sinus rhythm no acute change Telemetry:  Telemetry was personally reviewed and demonstrates:  NSR  Relevant CV Studies:  CT 09/03/17 IMPRESSION: 1. No acute findings within the chest, abdomen or pelvis. No aortic dissection. 2. Ascending thoracic aorta measures 3.9 cm diameter on today's study. Recommend annual imaging followup by CTA or MRA. This recommendation follows 2010 ACCF/AHA/AATS/ACR/ASA/SCA/SCAI/SIR/STS/SVM Guidelines for the Diagnosis and Management of Patients with Thoracic Aortic Disease. Circulation.2010; 121: [id number] 3. Coronary artery calcifications versus stent within the LAD. Heart size is normal. No pericardial effusion.   Electronically Signed   By: Bary RichardStan  Maynard M.D.   On: 09/03/2017 21:06  Echocardiogram 04/14/2014 Study Conclusions  - Left ventricle: The cavity size was normal. Wall thickness was   increased in a pattern of mild LVH. Systolic function was normal.   The estimated ejection fraction was in the range of 55% to 60%.   Images were inadequate for LV wall motion assessment, as the   endocardial borders were inadequately visualized. However, there   was no gross regional variation. Doppler parameters are   consistent with abnormal left ventricular relaxation (grade 1   diastolic dysfunction). - Aortic valve: Mildly calcified annulus. Trileaflet. - Left atrium: The atrium  was mildly dilated.  Coronary angiography:  Coronary dominance: Right   Left Main: Normal   Left Anterior Descending (LAD): Normal in size and mildly calcified. There is a 95% stenosis in the midsegment after the first septal perforator followed by a diffuse area of 80% disease.   1st diagonal (D1): Normal in size with minor irregularities.   2nd diagonal (D2): Normal in size with 50% ostial stenosis   3rd diagonal (D3): Small in size with minor irregularities.   Circumflex (LCx): Normal in size and nondominant. There is 60% ostial stenosis. The rest of the vessel has minor irregularities.   1st obtuse marginal: Normal in size with no significant disease.   2nd obtuse marginal: Small in size with no significant disease   3rd obtuse marginal: Normal in size with no significant disease   Right Coronary Artery: The normal in size and dominant. There is 20% proximal stenosis.   Posterior descending artery: Large in size with 80-90% proximal stenosis   Posterior AV segment: Normal in size with no significant disease.   Posterolateral branchs: No significant disease Left ventriculography: Left ventricular systolic function is mildly reduced , LVEF is estimated at 45 %, there is no significant mitral regurgitation . Mild anterior and apical hypokinesis.  PCI Note: Following the diagnostic procedure, the decision was made to proceed with PCI. Weight-based bivalirudin was given for anticoagulation. Once a therapeutic ACT was achieved, a 6 [place] XB LAD 3.5 guide catheter was inserted. A Runthrough coronary guidewire was used to cross the lesion. The lesion was predilated with a 2.5 x 15 balloon. I attempted to advance the wire into the second diagonal to protect the vessel. However, there was difficulty with poor visualization. The lesion was then stented with a 2.75 x 33 mm Xience drug-eluting stent. The stent was postdilated with a 3.0 x 15 noncompliant balloon in the proximal segment.  Following PCI, there was 0% residual stenosis and TIMI-3 flow. The second diagonal was jailed by the stent with worsened ostial stenosis but TIMI 3 flow.. The patient tolerated the procedure well. There were no immediate procedural complications. A TR band was used for radial hemostasis. The patient was transferred to the post catheterization recovery area for further monitoring.  PCI  Data: I will  Vessel - LAD/Segment - mid  Percent Stenosis (pre) 95%  TIMI-flow 3  Stent : 2.75 x 33 mm Xience drug-eluting stent postdilated with a 3.0 noncompliant balloon  Percent Stenosis (post) 0%  TIMI-flow (post) 3  Final Conclusions:  1. Significant three-vessel coronary artery disease with 95% mid LAD stenosis, 60% ostial left circumflex stenosis and 80-90% proximal right PDA stenosis. The culprit the LAD.  2. Mildly reduced LV systolic function with an ejection fraction of 45% and mildly elevated left ventricular end-diastolic pressure.  3. Successful angioplasty and drug-eluting stent placement to the mid LAD. The second diagonal was jailed by the stent with worsened ostial stenosis but TIMI 3 flow. This was left to be treated medically.  Recommendations:  Dual antiplatelet therapy for at least one year. PCI of the right PDA can be staged. The patient had severe anxiety throughout the case. It was difficult to sedate him.  He was having chest pain at the beginning of the diagnostic catheterization and worsened before PCI. He continued to have chest pain even after the case. He was started on nitroglycerin drip.     Hemodynamic Findings:  Central aortic pressure: 125/86  Angiographic Findings:  Left main: No obstructive disease.  Left Anterior Descending Artery: Large caliber vessel that courses to the apex. The mid stented segment is patent without restenosis. The first diagonal is moderate in caliber and is patent without stenosis. The second diagonal branch is jailed by the LAD stent with ostial 70%  narrowing but excellent flow.  Circumflex Artery: Large caliber vessel with ostial 60% stenosis. The first obtuse marginal branch is moderate in caliber with diffuse 30% stenosis. The second obtuse marginal branch is a moderate caliber bifurcating vessel with mild plaque.  Right Coronary Artery: Large dominant vessel with 20% proximal and mid stenosis. The PDA is moderate caliber vessel with 90-95% stenosis.  Left Ventricular Angiogram: Deferred.  Impression:  1. Double vessel CAD with patent LAD stent, severe PDA stenosis  2. Ongoing chest pain  3. Successful PTCA/DES x 1 PDA  Recommendations: Will continue ASA/Effient for at least one year. Continue statin, beta blocker. Likely d/c home in am.  Complications: None. The patient tolerated the procedure well.     Laboratory Data:  Chemistry Recent Labs  Lab 09/03/17 1713  NA 142  K 3.6  CL 107  CO2 22  GLUCOSE 99  BUN 14  CREATININE 1.04  CALCIUM 9.1  GFRNONAA >60  GFRAA >60  ANIONGAP 13    No results for input(s): PROT, ALBUMIN, AST, ALT, ALKPHOS, BILITOT in the last 168 hours. Hematology Recent Labs  Lab 09/03/17 1713  WBC 10.4  RBC 4.56  HGB 13.4  HCT 40.7  MCV 89.3  MCH 29.4  MCHC 32.9  RDW 12.4  PLT 279   Cardiac Enzymes Recent Labs  Lab 09/03/17 1713 09/03/17 2152 09/04/17 0400  TROPONINI <0.03 <0.03 <0.03   No results for input(s): TROPIPOC in the last 168 hours.   Radiology/Studies:  Dg Chest 2 View  Result Date: 09/03/2017 CLINICAL DATA:  Shortness of breath EXAM: CHEST  2 VIEW COMPARISON:  October 24 2016 FINDINGS: The heart size and mediastinal contours are stable. The heart size is enlarged. The lung volumes are low. Both lungs are clear. The visualized skeletal structures are unremarkable. IMPRESSION: No active cardiopulmonary disease. Electronically Signed   By: Sherian Rein M.D.   On: 09/03/2017 18:29   Ct Angio Chest/abd/pel For Dissection W And/or W/wo  Result  Date: 09/03/2017 CLINICAL DATA:   Patient reports chest pain that started yesterday. Patient reports nausea, weakness and SOB. Patient states his pain has become worse today; hx ascending aortic aneurysm; sarcoidosis^149mL ISOVUE-370 IOPAMIDOL (ISOVUE-370) INJECTION 76% EXAM: CT ANGIOGRAPHY CHEST, ABDOMEN AND PELVIS TECHNIQUE: Multidetector CT imaging through the chest, abdomen and pelvis was performed using the standard protocol during bolus administration of intravenous contrast. Multiplanar reconstructed images and MIPs were obtained and reviewed to evaluate the vascular anatomy. CONTRAST:  ISOVUE-370 IOPAMIDOL (ISOVUE-370) INJECTION 76% COMPARISON:  CTA chest dated 10/24/2016. CT abdomen dated 11/30/2014. FINDINGS: CTA CHEST FINDINGS Cardiovascular: Ascending thoracic aorta measures 3.9 cm diameter. No thoracic aortic dissection. Heart size is normal. No pericardial effusion. Coronary artery calcifications versus stent within the left anterior descending coronary artery. Mediastinum/Nodes: No mass or enlarged lymph nodes seen within the mediastinum or perihilar regions. Scattered small lymph nodes. Esophagus appears normal. Trachea and central bronchi are unremarkable. Lungs/Pleura: Mild atelectasis at the left lung base. Lungs otherwise clear. Musculoskeletal: Mild degenerative change within the thoracic spine. No acute or suspicious osseous finding. Review of the MIP images confirms the above findings. CTA ABDOMEN AND PELVIS FINDINGS VASCULAR Aorta: Normal caliber aorta without aneurysm, dissection, vasculitis or significant stenosis. Celiac: Patent without evidence of aneurysm, dissection, vasculitis or significant stenosis. SMA: Patent without evidence of aneurysm, dissection, vasculitis or significant stenosis. Renals: Both renal arteries are patent without evidence of aneurysm, dissection, vasculitis, fibromuscular dysplasia or significant stenosis. IMA: Patent without evidence of aneurysm, dissection, vasculitis or significant  stenosis. Inflow: Patent without evidence of aneurysm, dissection, vasculitis or significant stenosis. Veins: No obvious venous abnormality within the limitations of this arterial phase study. Review of the MIP images confirms the above findings. NON-VASCULAR Hepatobiliary: No focal liver abnormality is seen. No gallstones, gallbladder wall thickening, or biliary dilatation. Pancreas: Unremarkable. No pancreatic ductal dilatation or surrounding inflammatory changes. Spleen: Normal in size without focal abnormality. Adrenals/Urinary Tract: Adrenal glands appear normal. Kidneys are unremarkable without mass, stone or hydronephrosis. No ureteral or bladder calculi identified bilaterally. Bladder appears normal. Stomach/Bowel: Bowel is normal in caliber. No bowel wall thickening or evidence of bowel wall inflammation seen. Appendix appears normal. Stomach is unremarkable. Lymphatic: No enlarged lymph nodes seen. Reproductive: Prostate is unremarkable. Other: No free fluid or abscess collection. No free intraperitoneal air. Musculoskeletal: Mild degenerative change within the lumbar spine. No acute or suspicious osseous finding. Review of the MIP images confirms the above findings. IMPRESSION: 1. No acute findings within the chest, abdomen or pelvis. No aortic dissection. 2. Ascending thoracic aorta measures 3.9 cm diameter on today's study. Recommend annual imaging followup by CTA or MRA. This recommendation follows 2010 ACCF/AHA/AATS/ACR/ASA/SCA/SCAI/SIR/STS/SVM Guidelines for the Diagnosis and Management of Patients with Thoracic Aortic Disease. Circulation.2010; 121: Z610-R604 3. Coronary artery calcifications versus stent within the LAD. Heart size is normal. No pericardial effusion. Electronically Signed   By: Bary Richard M.D.   On: 09/03/2017 21:06    Assessment and Plan:   1.Chest pain troponins negative x3 EKG without acute change. Symptoms worrisome for angina. Will start IV heparin. Suspect he needs  cath. Long talk about compliance if he needs a stent. He says he'll be compliant.Agreeable to cath.I have reviewed the risks, indications, and alternatives to angioplasty and stenting with the patient. Risks include but are not limited to bleeding, infection, vascular injury, stroke, myocardial infection, arrhythmia, kidney injury, radiation-related injury in the case of prolonged fluoroscopy use, emergency cardiac surgery, and death. The patient understands the risks of serious  complication is low (<1%) and he agrees to proceed.   2.CAD status post NSTEMI 02/03/14 treated with DES to the LAD with jailing of D2 but TIMI-3 flow, staged DES to the PDA 02/04/14 with residual 50% D2, 60% ostial circumflex, LVEF 45% on LV gram but improved to 50-55% on echo 04/2014.  Noncompliance with Effient and Plavix and no follow-up since then. 3.Pulmonary sarcoidosis 4.Hyperlipidemia 5.Ascending thoracic aortic aneurysm 3.9 cm on CT 6.HTN 7.Obesity   For questions or updates, please contact [facility] Please consult [email or web address] for contact info under Cardiology/STEMI.   Elson Clan, PA-C 09/04/2017 9:44 AM   Attending note:  Patient seen and examined.  I reviewed previous records and updated the chart, also discussed the case and plan with Ms. Geni Bers PA-C.  Mr. Oestreicher has a history of pulmonary sarcoidosis based on previous biopsy (reportedly stable without active symptoms), hyperlipidemia, mild aneurysmal dilatation of the ascending thoracic aorta, essential hypertension, morbid obesity, and CAD status post NSTEMI in July 2015 managed with DES to the LAD and staged DES to the PDA.  He was not compliant with dual antiplatelet therapy following intervention and has had no regular cardiology follow-up since then.  He presents now reporting elevations in blood pressure on antihypertensive therapy within the last week, onset of chest tightness and shortness of breath within the last 24 hours, symptoms have  been waxing and waning including overnight during hospital observation.  Initial troponin I levels are negative for ACS, ECG nonspecific.  He has had no recent cough or hemoptysis.  Chest CTA showed no obvious acute findings in the lungs with stable mild dilatation of the thoracic aorta.  On examination this morning he continues to complain of mild chest discomfort, has been treated with Toradol and also placed on heparin.  Heart rate is in the 70s-80s in sinus rhythm by telemetry which I personally reviewed.  Systolic blood pressure in the 120s-140s.  Lungs are clear without labored breathing.  Cardiac exam reveals RRR without gallop.  Troponin I levels are less than 0.03, BUN 14 and creatinine 1.0.  Hemoglobin 13.4 with normal platelets.  I personally reviewed his ECG which shows sinus rhythm with nonspecific ST changes.  Patient presents with recent onset chest tightness concerning for unstable angina although ECG is nonspecific and troponin I levels are negative.  He has a history of CAD status post DES intervention to the LAD and PDA back in 2015, was not compliant with dual antiplatelet regimen at that point.  We discussed transfer to [facility] in anticipation of a diagnostic cardiac catheterization.  After reviewing the risks and benefits he is in agreement to proceed.  Also reinforced compliance with medical therapy if he were to undergo repeat coronary intervention.  Jonelle Sidle, M.D., F.A.C.C.

## 2017-09-04 NOTE — Progress Notes (Signed)
Informed consent obtained from patient for cardiac cath procedure and [facility]. [facility] here to transport patient.

## 2017-09-05 DIAGNOSIS — I200 Unstable angina: Secondary | ICD-10-CM | POA: Diagnosis not present

## 2017-09-05 LAB — CBC
HEMATOCRIT: 42.6 % (ref 39.0–52.0)
HEMOGLOBIN: 14.6 g/dL (ref 13.0–17.0)
MCH: 30.2 pg (ref 26.0–34.0)
MCHC: 34.3 g/dL (ref 30.0–36.0)
MCV: 88.2 fL (ref 78.0–100.0)
Platelets: 281 10*3/uL (ref 150–400)
RBC: 4.83 MIL/uL (ref 4.22–5.81)
RDW: 12.5 % (ref 11.5–15.5)
WBC: 11.5 10*3/uL — ABNORMAL HIGH (ref 4.0–10.5)

## 2017-09-05 LAB — HIV ANTIBODY (ROUTINE TESTING W REFLEX): HIV SCREEN 4TH GENERATION: NONREACTIVE

## 2017-09-05 MED ORDER — PANTOPRAZOLE SODIUM 40 MG PO TBEC: 40.0000 mg | DELAYED_RELEASE_TABLET | Freq: Two times a day (BID) | ORAL | Status: DC

## 2017-09-05 MED ORDER — PANTOPRAZOLE SODIUM 40 MG PO TBEC: 40.0000 mg | DELAYED_RELEASE_TABLET | Freq: Every day | ORAL | 0 refills | Status: DC

## 2017-09-05 MED ORDER — METOPROLOL TARTRATE 50 MG PO TABS: 50.0000 mg | ORAL_TABLET | Freq: Two times a day (BID) | ORAL | 0 refills | Status: AC

## 2017-09-05 MED ORDER — ATORVASTATIN CALCIUM 80 MG PO TABS: 80.0000 mg | ORAL_TABLET | Freq: Every day | ORAL | 0 refills | Status: AC

## 2017-09-05 MED ORDER — METOPROLOL TARTRATE 25 MG PO TABS
50.0000 mg | ORAL_TABLET | Freq: Two times a day (BID) | ORAL | Status: DC
  Filled 2017-09-05: qty 2

## 2017-09-05 NOTE — Discharge Summary (Signed)
Physician Discharge Summary  Patient ID: Brett Harper MRN: [id number] DOB/AGE: 49-Mar-1972 49 y.o.  Admit date: 09/03/2017 Discharge date: 09/05/2017  Admission Diagnoses:  Discharge Diagnoses:  Principal Problem:   Unstable angina (HCC)   Hypertensive Hypertension   CAD s/p CABG   -Likely undiagnosed obstructive sleep apnea and obesity obesity hypoventilation syndrome.hypoventilation syndrome.   Morbid Obesity- BMI 47   Chest pain   Ascending aortic aneurysm (3.9 cm diameter of the ascending aorta)   Mild LVH  Trivial mitral regurgitation.   Sclerotic aortic valve without stenosis.   Discharged Condition: stable  Hospital Course: Patient is a 47 year old Caucasian male, morbidly obese, with past medical history significant for gout, aortic aneurysm hypertension, N STEMI status post PCI, and pulmonary sarcoidosis.  Patient was admitted with chest pain.  Patient was admitted for further assessment and management.  The cardiac enzymes were cycled.  Based on patient's prior history, cardiology team was consulted.  The patient proceeded with cardiac catheterization.  Cardiac catheter has not revealed any significant finding.  CT angios of the chest and abdomen was negative for aortic dissection, and the ascending thoracic aorta was said to measure 3.9 cm (there will be need to monitor patient's ascending aorta diameter every year), coronary artery calcifications were also noted.  Echo cardiogram done revealed estimated ejection fraction of 55%, mild left ventricular hypertrophy, trivial mitral regurgitation, and a sclerotic aortic valve without stenosis.  Patient has remained chest pain-free.  The patient has been cleared for discharge by the cardiology team.  Further questioning during the hospital stay revealed that the patient snores, and may likely have obstructive sleep apnea.  It has been communicated to the patient and the patient's wife that the patient will need to be worked up for  possible obstructive sleep apnea.  Patient is eager to be discharged back home today  Consults: cardiology  Significant Diagnostic Studies: angiography: Cardiac catheterization.  Medical management will be continued.  Discharge Exam: Blood pressure (!) 141/97, pulse 86, temperature 97.6 F (36.4 C), temperature source Oral, resp. rate 12, height 5\' 11"  (1.803 m), weight (!) 146.4 kg (322 lb 12.1 oz), SpO2 97 %.   Disposition: 01-Home or Self Care  Discharge Instructions    Call MD for:   Complete by:  As directed    Please call MD if symptoms worsen   Diet - low sodium heart healthy   Complete by:  As directed    Increase activity slowly   Complete by:  As directed      Allergies as of 09/05/2017      Reactions   Naproxen Other (See Comments)   bleeding      Medication List    STOP taking these medications   meloxicam 15 MG tablet Commonly known as:  MOBIC     TAKE these medications   aspirin 81 MG EC tablet Take 1 tablet (81 mg total) by mouth daily.   atorvastatin 80 MG tablet Commonly known as:  LIPITOR Take 1 tablet (80 mg total) by mouth daily at 6 PM.   metoprolol tartrate 50 MG tablet Commonly known as:  LOPRESSOR Take 1 tablet (50 mg total) by mouth 2 (two) times daily. What changed:    medication strength  how much to take   nitroGLYCERIN 0.4 MG SL tablet Commonly known as:  NITROSTAT Place 1 tablet (0.4 mg total) under the tongue every 5 (five) minutes as needed for chest pain.   omeprazole 20 MG tablet Commonly known as:  PRILOSEC  OTC Take 20 mg by mouth daily.   pantoprazole 40 MG tablet Commonly known as:  PROTONIX Take 1 tablet (40 mg total) by mouth daily.        SignedBarnetta Chapel: Cyril Railey I Chayil Gantt 09/05/2017, 12:18 PM

## 2017-09-05 NOTE — Progress Notes (Signed)
Progress Note  Patient Name: Brett Harper Date of Encounter: 09/05/2017  Primary Cardiologist: Dina Rich, MD   Subjective   Denies any chest pain, no SOB. Has not ambulated this morning.   Inpatient Medications    Scheduled Meds: . aspirin  81 mg Oral Daily  . atorvastatin  80 mg Oral q1800  . enoxaparin (LOVENOX) injection  40 mg Subcutaneous Q24H  . metoprolol tartrate  25 mg Oral BID  . nitroGLYCERIN  1 inch Topical Q6H  . pantoprazole (PROTONIX) IV  40 mg Intravenous Q12H  . sodium chloride flush  3 mL Intravenous Q12H   Continuous Infusions: . sodium chloride     PRN Meds: sodium chloride, acetaminophen, ALPRAZolam, hydrALAZINE, nitroGLYCERIN, ondansetron (ZOFRAN) IV, sodium chloride flush, zolpidem   Vital Signs    Vitals:   09/04/17 2304 09/04/17 2324 09/04/17 2344 09/05/17 0410  BP: (!) 199/87 (!) 171/126 (!) 165/95 (!) 153/75  Pulse:    78  Resp:  16 19 (!) 24  Temp:    97.7 F (36.5 C)  TempSrc:    Oral  SpO2:   99% 99%  Weight:    (!) 322 lb 12.1 oz (146.4 kg)  Height:        Intake/Output Summary (Last 24 hours) at 09/05/2017 0746 Last data filed at 09/04/2017 2200 Gross per 24 hour  Intake 1370 ml  Output 1350 ml  Net 20 ml   Filed Weights   09/03/17 1651 09/04/17 0435 09/05/17 0410  Weight: (!) 320 lb (145.2 kg) (!) 322 lb 5 oz (146.2 kg) (!) 322 lb 12.1 oz (146.4 kg)    Telemetry    NSR with HR 70s - Personally Reviewed  ECG    NSR without significant ST-T wave changes - Personally Reviewed  Physical Exam   GEN: No acute distress.   Neck: No JVD Cardiac: RRR, no murmurs, rubs, or gallops.  Respiratory: Clear to auscultation bilaterally. GI: Soft, nontender, non-distended  MS: No edema; No deformity. Neuro:  Nonfocal  Psych: Normal affect   Labs    Chemistry Recent Labs  Lab 09/03/17 1713 09/04/17 1415  NA 142  --   K 3.6  --   CL 107  --   CO2 22  --   GLUCOSE 99  --   BUN 14  --   CREATININE 1.04 1.14    CALCIUM 9.1  --   GFRNONAA >60 >60  GFRAA >60 >60  ANIONGAP 13  --      Hematology Recent Labs  Lab 09/03/17 1713 09/04/17 1415 09/05/17 0438  WBC 10.4 8.5 11.5*  RBC 4.56 4.37 4.83  HGB 13.4 13.2 14.6  HCT 40.7 39.6 42.6  MCV 89.3 90.6 88.2  MCH 29.4 30.2 30.2  MCHC 32.9 33.3 34.3  RDW 12.4 13.0 12.5  PLT 279 258 281    Cardiac Enzymes Recent Labs  Lab 09/03/17 1713 09/03/17 2152 09/04/17 0400 09/04/17 1012  TROPONINI <0.03 <0.03 <0.03 <0.03   No results for input(s): TROPIPOC in the last 168 hours.   BNPNo results for input(s): BNP, PROBNP in the last 168 hours.   DDimer No results for input(s): DDIMER in the last 168 hours.   Radiology    Dg Chest 2 View  Result Date: 09/03/2017 CLINICAL DATA:  Shortness of breath EXAM: CHEST  2 VIEW COMPARISON:  October 24 2016 FINDINGS: The heart size and mediastinal contours are stable. The heart size is enlarged. The lung volumes are low. Both lungs are clear.  The visualized skeletal structures are unremarkable. IMPRESSION: No active cardiopulmonary disease. Electronically Signed   By: Sherian ReinWei-Chen  Lin M.D.   On: 09/03/2017 18:29   Ct Angio Chest/abd/pel For Dissection W And/or W/wo  Result Date: 09/03/2017 CLINICAL DATA:  Patient reports chest pain that started yesterday. Patient reports nausea, weakness and SOB. Patient states his pain has become worse today; hx ascending aortic aneurysm; sarcoidosis^1600mL ISOVUE-370 IOPAMIDOL (ISOVUE-370) INJECTION 76% EXAM: CT ANGIOGRAPHY CHEST, ABDOMEN AND PELVIS TECHNIQUE: Multidetector CT imaging through the chest, abdomen and pelvis was performed using the standard protocol during bolus administration of intravenous contrast. Multiplanar reconstructed images and MIPs were obtained and reviewed to evaluate the vascular anatomy. CONTRAST:  100mL ISOVUE-370 IOPAMIDOL (ISOVUE-370) INJECTION 76% COMPARISON:  CTA chest dated 10/24/2016. CT abdomen dated 11/30/2014. FINDINGS: CTA CHEST FINDINGS  Cardiovascular: Ascending thoracic aorta measures 3.9 cm diameter. No thoracic aortic dissection. Heart size is normal. No pericardial effusion. Coronary artery calcifications versus stent within the left anterior descending coronary artery. Mediastinum/Nodes: No mass or enlarged lymph nodes seen within the mediastinum or perihilar regions. Scattered small lymph nodes. Esophagus appears normal. Trachea and central bronchi are unremarkable. Lungs/Pleura: Mild atelectasis at the left lung base. Lungs otherwise clear. Musculoskeletal: Mild degenerative change within the thoracic spine. No acute or suspicious osseous finding. Review of the MIP images confirms the above findings. CTA ABDOMEN AND PELVIS FINDINGS VASCULAR Aorta: Normal caliber aorta without aneurysm, dissection, vasculitis or significant stenosis. Celiac: Patent without evidence of aneurysm, dissection, vasculitis or significant stenosis. SMA: Patent without evidence of aneurysm, dissection, vasculitis or significant stenosis. Renals: Both renal arteries are patent without evidence of aneurysm, dissection, vasculitis, fibromuscular dysplasia or significant stenosis. IMA: Patent without evidence of aneurysm, dissection, vasculitis or significant stenosis. Inflow: Patent without evidence of aneurysm, dissection, vasculitis or significant stenosis. Veins: No obvious venous abnormality within the limitations of this arterial phase study. Review of the MIP images confirms the above findings. NON-VASCULAR Hepatobiliary: No focal liver abnormality is seen. No gallstones, gallbladder wall thickening, or biliary dilatation. Pancreas: Unremarkable. No pancreatic ductal dilatation or surrounding inflammatory changes. Spleen: Normal in size without focal abnormality. Adrenals/Urinary Tract: Adrenal glands appear normal. Kidneys are unremarkable without mass, stone or hydronephrosis. No ureteral or bladder calculi identified bilaterally. Bladder appears normal.  Stomach/Bowel: Bowel is normal in caliber. No bowel wall thickening or evidence of bowel wall inflammation seen. Appendix appears normal. Stomach is unremarkable. Lymphatic: No enlarged lymph nodes seen. Reproductive: Prostate is unremarkable. Other: No free fluid or abscess collection. No free intraperitoneal air. Musculoskeletal: Mild degenerative change within the lumbar spine. No acute or suspicious osseous finding. Review of the MIP images confirms the above findings. IMPRESSION: 1. No acute findings within the chest, abdomen or pelvis. No aortic dissection. 2. Ascending thoracic aorta measures 3.9 cm diameter on today's study. Recommend annual imaging followup by CTA or MRA. This recommendation follows 2010 ACCF/AHA/AATS/ACR/ASA/SCA/SCAI/SIR/STS/SVM Guidelines for the Diagnosis and Management of Patients with Thoracic Aortic Disease. Circulation.2010; 121: [id number] 3. Coronary artery calcifications versus stent within the LAD. Heart size is normal. No pericardial effusion. Electronically Signed   By: Bary RichardStan  Maynard M.D.   On: 09/03/2017 21:06    Cardiac Studies   Cath 09/04/2017 Conclusion     Ost RPDA to RPDA lesion is 10% stenosed.  RPDA lesion is 50% stenosed.  Prox RCA lesion is 30% stenosed.  Ost Cx lesion is 60% stenosed.  Ost 1st Mrg to 1st Mrg lesion is 60% stenosed.  Prox LAD to Mid LAD lesion is  10% stenosed.  Mid LAD lesion is 40% stenosed.   1.  Widely patent stents in the LAD and right PDA with minimal restenosis.  Moderate ostial left circumflex disease and moderate disease in OM1. 2.  Left ventricular angiography was not performed.  EF was normal by echo. 3.  Mildly elevated left ventricular end-diastolic pressure.  Recommendations: The patient has moderate nonobstructive disease with patent stents.  Recommend continuing aggressive medical therapy and blood pressure control.    Echo 09/04/2017 LV EF:  55%  ------------------------------------------------------------------- Indications:      Chest pain 786.51.  ------------------------------------------------------------------- History:   PMH:  NSTEMI (non-ST elevated myocardial infarction) Acquired from the patient and from the patient&'s chart.  PMH: Ascending aortic aneurysm Morbid Obesity.  Risk factors: Hypertension. Dyslipidemia.  ------------------------------------------------------------------- Study Conclusions  - Left ventricle: The cavity size was normal. Wall thickness was   increased in a pattern of mild LVH. The estimated ejection   fraction was 55%. Although no diagnostic regional wall motion   abnormality was identified, this possibility cannot be completely   excluded on the basis of this study. Doppler parameters are   consistent with abnormal left ventricular relaxation (grade 1   diastolic dysfunction). - Aortic valve: Mildly to moderately calcified annulus. Trileaflet;   mildly calcified leaflets. - Mitral valve: There was trivial regurgitation. - Right atrium: Central venous pressure (est): 3 mm Hg. - Atrial septum: No defect or patent foramen ovale was identified. - Tricuspid valve: There was trivial regurgitation. - Pulmonary arteries: PA peak pressure: 30 mm Hg (S). - Pericardium, extracardiac: There was no pericardial effusion.  Impressions:  - Mild LVH with LVEF 55% and grade 1 diastolic dysfunction. Trivial   mitral regurgitation. Sclerotic aortic valve without stenosis.   Trivial tricuspid regurgitation with PASP 30 mmHg.   Patient Profile     47 y.o. male with PMH of CAD and noncompliance who was seen for chest pain. Previous NSTEMI in 2015, noncompliant with followup. Cath 09/04/2017 showed nonobstructive disease, medical therapy   Assessment & Plan    1. Chest pain:  Resolved, cath 09/04/2017 50% RPDA, 30% prox RCA, 60% ost LCx, 60% ost OM1, 40% mid LAD lesion. Echo 09/04/2017 55%  -  expect discharge today by Internal Medicine. Close followup with cardiology in 2-4 weeks  2. CAD s/p NSTEMI 02/2014 treated with DES to LAD with jailing of D2  3. HTN: uncontrolled, HR 70s, increase metoprolol to 50mg  BID. Further uptitration of blood pressure medication as outpatient.   4. HLD: on high dose statin, last LDL 153 in 2015, will need fasting lipid panel as outpatient  5. Pulmonary sarcoidosis  6. Ascending thoracic aortic aneurysm 3.9 cm on CT   For questions or updates, please contact [facility] Please consult [email or web address] for contact info under Cardiology/STEMI.      Ramond Dial, PA  09/05/2017, 7:46 AM    Patient seen and examined.  He denies chest pain or dyspnea.  Radial cath site with no hematoma.  Plan medical therapy.  Continue with aspirin and statin.  Continue beta-blocker.  Blood pressure elevated.  Add amlodipine 5 mg daily and follow.  He will need follow-up chest 1 year for dilated aortic root.  Follow-up with cardiology 2-4 weeks after discharge.  Patient can be discharged from a cardiac standpoint.  Please call with questions. Olga Millers, MD

## 2017-09-20 ENCOUNTER — Ambulatory Visit: Payer: 59 | Admitting: Cardiology

## 2017-09-23 ENCOUNTER — Encounter: Payer: Self-pay | Admitting: Cardiology

## 2017-10-10 ENCOUNTER — Ambulatory Visit (INDEPENDENT_AMBULATORY_CARE_PROVIDER_SITE_OTHER): Payer: 59 | Admitting: Cardiology

## 2017-10-10 ENCOUNTER — Encounter: Payer: Self-pay | Admitting: Cardiology

## 2017-10-10 VITALS — BP 152/88 | HR 72 | Ht 71.0 in | Wt 315.0 lb

## 2017-10-10 DIAGNOSIS — Z79899 Other long term (current) drug therapy: Secondary | ICD-10-CM | POA: Diagnosis not present

## 2017-10-10 DIAGNOSIS — I2510 Atherosclerotic heart disease of native coronary artery without angina pectoris: Secondary | ICD-10-CM | POA: Diagnosis not present

## 2017-10-10 DIAGNOSIS — G4730 Sleep apnea, unspecified: Secondary | ICD-10-CM | POA: Diagnosis not present

## 2017-10-10 DIAGNOSIS — E782 Mixed hyperlipidemia: Secondary | ICD-10-CM

## 2017-10-10 DIAGNOSIS — I10 Essential (primary) hypertension: Secondary | ICD-10-CM

## 2017-10-10 MED ORDER — LISINOPRIL 10 MG PO TABS: 10.0000 mg | ORAL_TABLET | Freq: Every day | ORAL | 3 refills | Status: DC

## 2017-10-10 MED ORDER — PANTOPRAZOLE SODIUM 40 MG PO TBEC: 40.0000 mg | DELAYED_RELEASE_TABLET | Freq: Every day | ORAL | 3 refills | Status: AC

## 2017-10-10 NOTE — Patient Instructions (Signed)
Medication Instructions:  STOP OMEPRAZOLE   START LISINOPRIL 10 MG DAILY START PROTONIX 40 MG DAILY    Labwork: 2 WEEKS  BMET   Testing/Procedures: NONE  Follow-Up: Your physician recommends that you schedule a follow-up appointment in: 4 MONTHS    Any Other Special Instructions Will Be Listed Below (If Applicable).  You have been referred to DR. HAWKINS (S[facility]), SOMEONE FROM HIS OFFICE WILL CONTACT YOU TO SCHEDULE APPOINTMENT     If you need a refill on your cardiac medications before your next appointment, please call your pharmacy.

## 2017-10-10 NOTE — Progress Notes (Signed)
Clinical Summary Brett Harper is a 47 y.o.male seen today for follow up of the following medical problems.   1. CAD - 02/2014 admitted to [facility] with NSTEMI - cath 02/03/14 with LM normal, LAD 95% mid with more distal 80%, D2 50%, LCX ostial 60%, RCA PDA 90% prox, LVEF 45% by LV gram. Received DES to LAD with jailing of D2 but still with TIMI 3 flow. Continued to have chest pain. The following day she had staged PCI to PDA with DES. Had ongoing chest pain after procedures.  - no echo on file   - since discharge has had some generalized fatigue, new since admission. Felt lightheaded and dizzy, fell in shower but did not lose consciousness. Dizziness only when on feet.  - compliant with meds, including DAPT with ASA and effient. - awaiting to start cardiac rehab   -admit Jan 2019 with chest pain. Cath as reported below, moderate nonobstructive disease.  - difficult to explain pain. Left sided. Mostly at night with lying down. Lasts several hours. No relation to food. Can have some SOB - compliant with meds  2. Hyperlipidemia - 02/2014 TC 214 TG 109 HDL 39 LDL 153 - started on statin during admission 02/2014  - compliant with meds  3. OSA? - +snore, +apneic episodes. + daytime somnolence  4. Ascending thoracic aneurysm - CTA Jan 2019 3.9 cm, needs repeat Jan 2020 - no recent symptoms  5. HTN - he is compliant with meds   Past Medical History:  Diagnosis Date  . CAD (coronary artery disease)    DES to LAD and staged DES to PDA June 2015  . Essential hypertension   . Gout   . NSTEMI (non-ST elevated myocardial infarction) [facility])    June 2015  . Obesity   . Pulmonary sarcoidosis (HCC)    a. 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 Naproxen Other (See Comments)    bleeding     Current Outpatient Medications  Medication Sig Dispense Refill  . aspirin EC 81 MG EC tablet Take 1  tablet (81 mg total) by mouth daily.    Marland Kitchen atorvastatin (LIPITOR) 80 MG tablet Take 1 tablet (80 mg total) by mouth daily at 6 PM. 30 tablet 0  . metoprolol tartrate (LOPRESSOR) 50 MG tablet Take 1 tablet (50 mg total) by mouth 2 (two) times daily. 60 tablet 0  . nitroGLYCERIN (NITROSTAT) 0.4 MG SL tablet Place 1 tablet (0.4 mg total) under the tongue every 5 (five) minutes as needed for chest pain. 25 tablet 3  . omeprazole (PRILOSEC OTC) 20 MG tablet Take 20 mg by mouth daily.     . pantoprazole (PROTONIX) 40 MG tablet Take 1 tablet (40 mg total) by mouth daily. 30 tablet 0   No current facility-administered medications for this visit.      Past Surgical History:  Procedure Laterality Date  . INGUINAL HERNIA REPAIR    . LEFT HEART CATH AND CORONARY ANGIOGRAPHY N/A 09/04/2017   Procedure: LEFT HEART CATH AND CORONARY ANGIOGRAPHY;  Surgeon: Iran Ouch, MD;  Location: [facility] INVASIVE CV LAB;  Service: Cardiovascular;  Laterality: N/A;  . LEFT HEART CATHETERIZATION WITH CORONARY ANGIOGRAM N/A 02/03/2014   Procedure: LEFT HEART CATHETERIZATION WITH CORONARY ANGIOGRAM;  Surgeon: Iran Ouch, MD;  Location: [facility] CATH LAB;  Service: Cardiovascular;  Laterality: N/A;  . LEFT HEART CATHETERIZATION WITH CORONARY ANGIOGRAM N/A  02/04/2014   Procedure: LEFT HEART CATHETERIZATION WITH CORONARY ANGIOGRAM;  Surgeon: Kathleene Hazel, MD;  Location: [facility] CATH LAB;  Service: Cardiovascular;  Laterality: N/A;     Allergies  Allergen Reactions  . Naproxen Other (See Comments)    bleeding      Family History  Problem Relation Age of Onset  . Hypertension Mother   . Heart attack Father   . Diabetes Father   . Stroke Father   . Heart attack Maternal Grandfather   . Heart attack Paternal Grandfather   . Heart attack Cousin         stent placed in 28s     Social History Brett Harper reports that  has never smoked. he has never used smokeless tobacco. Brett Harper reports that he does not drink  alcohol.   Review of Systems CONSTITUTIONAL: No weight loss, fever, chills, weakness or fatigue.  HEENT: Eyes: No visual loss, blurred vision, double vision or yellow sclerae.No hearing loss, sneezing, congestion, runny nose or sore throat.  SKIN: No rash or itching.  CARDIOVASCULAR: per hpi RESPIRATORY: No shortness of breath, cough or sputum.  GASTROINTESTINAL: No anorexia, nausea, vomiting or diarrhea. No abdominal pain or blood.  GENITOURINARY: No burning on urination, no polyuria NEUROLOGICAL: No headache, dizziness, syncope, paralysis, ataxia, numbness or tingling in the extremities. No change in bowel or bladder control.  MUSCULOSKELETAL: No muscle, back pain, joint pain or stiffness.  LYMPHATICS: No enlarged nodes. No history of splenectomy.  PSYCHIATRIC: No history of depression or anxiety.  ENDOCRINOLOGIC: No reports of sweating, cold or heat intolerance. No polyuria or polydipsia.  Marland Kitchen   Physical Examination Vitals:   10/10/17 1339  BP: (!) 152/88  Pulse: 72  SpO2: 96%   Vitals:   10/10/17 1339  Weight: (!) 315 lb (142.9 kg)  Height: 5\' 11"  (1.803 m)    Gen: resting comfortably, no acute distress HEENT: no scleral icterus, pupils equal round and reactive, no palptable cervical adenopathy,  CV: RRR, no m/r/g, no jvd Resp: Clear to auscultation bilaterally GI: abdomen is soft, non-tender, non-distended, normal bowel sounds, no hepatosplenomegaly MSK: extremities are warm, no edema.  Skin: warm, no rash Neuro:  no focal deficits Psych: appropriate affect   Diagnostic Studies Hemodynamics:  AO: 110/87 mmHg  LV: 117/13 mmHg  LVEDP: 18 mmHg  Coronary angiography:  Coronary dominance: Right   Left Main: Normal   Left Anterior Descending (LAD): Normal in size and mildly calcified. There is a 95% stenosis in the midsegment after the first septal perforator followed by a diffuse area of 80% disease.   1st diagonal (D1): Normal in size with minor irregularities.     2nd diagonal (D2): Normal in size with 50% ostial stenosis   3rd diagonal (D3): Small in size with minor irregularities.   Circumflex (LCx): Normal in size and nondominant. There is 60% ostial stenosis. The rest of the vessel has minor irregularities.   1st obtuse marginal: Normal in size with no significant disease.   2nd obtuse marginal: Small in size with no significant disease   3rd obtuse marginal: Normal in size with no significant disease   Right Coronary Artery: The normal in size and dominant. There is 20% proximal stenosis.   Posterior descending artery: Large in size with 80-90% proximal stenosis   Posterior AV segment: Normal in size with no significant disease.   Posterolateral branchs: No significant disease Left ventriculography: Left ventricular systolic function is mildly reduced , LVEF is estimated at 45 %,  there is no significant mitral regurgitation . Mild anterior and apical hypokinesis.  PCI Note: Following the diagnostic procedure, the decision was made to proceed with PCI. Weight-based bivalirudin was given for anticoagulation. Once a therapeutic ACT was achieved, a 6 Jamaica[place] XB LAD 3.5 guide catheter was inserted. A Runthrough coronary guidewire was used to cross the lesion. The lesion was predilated with a 2.5 x 15 balloon. I attempted to advance the wire into the second diagonal to protect the vessel. However, there was difficulty with poor visualization. The lesion was then stented with a 2.75 x 33 mm Xience drug-eluting stent. The stent was postdilated with a 3.0 x 15 noncompliant balloon in the proximal segment. Following PCI, there was 0% residual stenosis and TIMI-3 flow. The second diagonal was jailed by the stent with worsened ostial stenosis but TIMI 3 flow.. The patient tolerated the procedure well. There were no immediate procedural complications. A TR band was used for radial hemostasis. The patient was transferred to the post catheterization recovery area  for further monitoring.  PCI Data: I will  Vessel - LAD/Segment - mid  Percent Stenosis (pre) 95%  TIMI-flow 3  Stent : 2.75 x 33 mm Xience drug-eluting stent postdilated with a 3.0 noncompliant balloon  Percent Stenosis (post) 0%  TIMI-flow (post) 3  Final Conclusions:  1. Significant three-vessel coronary artery disease with 95% mid LAD stenosis, 60% ostial left circumflex stenosis and 80-90% proximal right PDA stenosis. The culprit the LAD.  2. Mildly reduced LV systolic function with an ejection fraction of 45% and mildly elevated left ventricular end-diastolic pressure.  3. Successful angioplasty and drug-eluting stent placement to the mid LAD. The second diagonal was jailed by the stent with worsened ostial stenosis but TIMI 3 flow. This was left to be treated medically.  Recommendations:  Dual antiplatelet therapy for at least one year. PCI of the right PDA can be staged. The patient had severe anxiety throughout the case. It was difficult to sedate him.  He was having chest pain at the beginning of the diagnostic catheterization and worsened before PCI. He continued to have chest pain even after the case. He was started on nitroglycerin drip.   Hemodynamic Findings:  Central aortic pressure: 125/86  Angiographic Findings:  Left main: No obstructive disease.  Left Anterior Descending Artery: Large caliber vessel that courses to the apex. The mid stented segment is patent without restenosis. The first diagonal is moderate in caliber and is patent without stenosis. The second diagonal Lynnix Schoneman is jailed by the LAD stent with ostial 70% narrowing but excellent flow.  Circumflex Artery: Large caliber vessel with ostial 60% stenosis. The first obtuse marginal Akeem Heppler is moderate in caliber with diffuse 30% stenosis. The second obtuse marginal Gwendolynn Merkey is a moderate caliber bifurcating vessel with mild plaque.  Right Coronary Artery: Large dominant vessel with 20% proximal and mid stenosis. The  PDA is moderate caliber vessel with 90-95% stenosis.  Left Ventricular Angiogram: Deferred.  Impression:  1. Double vessel CAD with patent LAD stent, severe PDA stenosis  2. Ongoing chest pain  3. Successful PTCA/DES x 1 PDA  Recommendations: Will continue ASA/Effient for at least one year. Continue statin, beta blocker. Likely d/c home in am.  Complications: None. The patient tolerated the procedure well.    Jan 2019 cath  Ost RPDA to RPDA lesion is 10% stenosed.  RPDA lesion is 50% stenosed.  Prox RCA lesion is 30% stenosed.  Ost Cx lesion is 60% stenosed.  Ost 1st Mrg  to 1st Mrg lesion is 60% stenosed.  Prox LAD to Mid LAD lesion is 10% stenosed.  Mid LAD lesion is 40% stenosed.   1.  Widely patent stents in the LAD and right PDA with minimal restenosis.  Moderate ostial left circumflex disease and moderate disease in OM1. 2.  Left ventricular angiography was not performed.  EF was normal by echo. 3.  Mildly elevated left ventricular end-diastolic pressure.  Recommendations: The patient has moderate nonobstructive disease with patent stents.  Recommend continuing aggressive medical therapy and blood pressure control.  Jan 2019 echo Study Conclusions  - Left ventricle: The cavity size was normal. Wall thickness was   increased in a pattern of mild LVH. The estimated ejection   fraction was 55%. Although no diagnostic regional wall motion   abnormality was identified, this possibility cannot be completely   excluded on the basis of this study. Doppler parameters are   consistent with abnormal left ventricular relaxation (grade 1   diastolic dysfunction). - Aortic valve: Mildly to moderately calcified annulus. Trileaflet;   mildly calcified leaflets. - Mitral valve: There was trivial regurgitation. - Right atrium: Central venous pressure (est): 3 mm Hg. - Atrial septum: No defect or patent foramen ovale was identified. - Tricuspid valve: There was trivial  regurgitation. - Pulmonary arteries: PA peak pressure: 30 mm Hg (S). - Pericardium, extracardiac: There was no pericardial effusion.  Impressions:  - Mild LVH with LVEF 55% and grade 1 diastolic dysfunction. Trivial   mitral regurgitation. Sclerotic aortic valve without stenosis.   Trivial tricuspid regurgitation with PASP 30 mmHg.   Assessment and Plan   1. CAD - prior NSTEMI, s/p stenting to LAD and PDA - medical therapy has been limited by prior orthostatic symptoms and fatigue. -recent atypical chest pain symptoms, trial of protonix, d/c omeprazole.   2. Hyperlipidemia - continue statin  3. OSA? - several risk factors for OSA, refer to Dr Juanetta Gosling.   4. Dilated aortic root - continue to monitor  5. HTN  - above goal, will take lisinopril 10mg  daily. BMET in 2 weeks.    Antoine Poche, M.D.

## 2017-10-28 ENCOUNTER — Other Ambulatory Visit: Payer: Self-pay | Admitting: Thoracic Surgery (Cardiothoracic Vascular Surgery)

## 2017-10-28 DIAGNOSIS — I712 Thoracic aortic aneurysm, without rupture, unspecified: Secondary | ICD-10-CM

## 2017-10-28 DIAGNOSIS — I7121 Aneurysm of the ascending aorta, without rupture: Secondary | ICD-10-CM

## 2017-11-06 ENCOUNTER — Encounter: Payer: Self-pay | Admitting: Cardiology

## 2017-12-04 ENCOUNTER — Telehealth: Payer: Self-pay | Admitting: *Deleted

## 2017-12-09 ENCOUNTER — Telehealth: Payer: Self-pay | Admitting: *Deleted

## 2017-12-10 ENCOUNTER — Ambulatory Visit: Payer: 59 | Admitting: Thoracic Surgery (Cardiothoracic Vascular Surgery)

## 2018-07-01 IMAGING — CT CT ANGIO CHEST-ABD-PELV FOR DISSECTION W/ AND WO/W CM
2 of 7 series · 14 of 46 positions shown, 16 images · IV contrast (Isovue)
Comparison: CTA chest dated 10/24/2016. CT abdomen dated
11/30/2014.

CLINICAL DATA: Patient reports chest pain that started yesterday.
Patient reports nausea, weakness and SOB. Patient states his pain
has become worse today; hx ascending aortic aneurysm;
sarcoidosis^100mL [id number] IOPAMIDOL ([id number]) INJECTION 76%

EXAM:
CT ANGIOGRAPHY CHEST, ABDOMEN AND PELVIS
TECHNIQUE: Multidetector CT imaging through the chest, abdomen and pelvis was
performed using the standard protocol during bolus administration of
intravenous contrast. Multiplanar reconstructed images and MIPs were
obtained and reviewed to evaluate the vascular anatomy.
CONTRAST:  100mL [id number] IOPAMIDOL ([id number]) INJECTION 76%

[Series 5: dissection axial arterial · axial · arterial · 0.81mm/px · z∈[+936,+1521]mm · 11 of 226 slices shown, 13 images]
[im 16/226  soft-tissue]
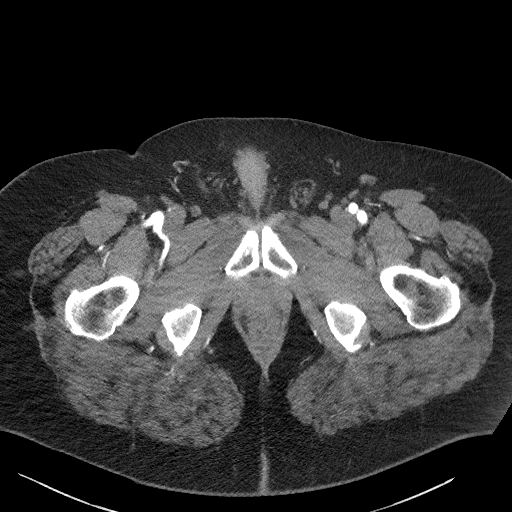
[im 16/226  bone]
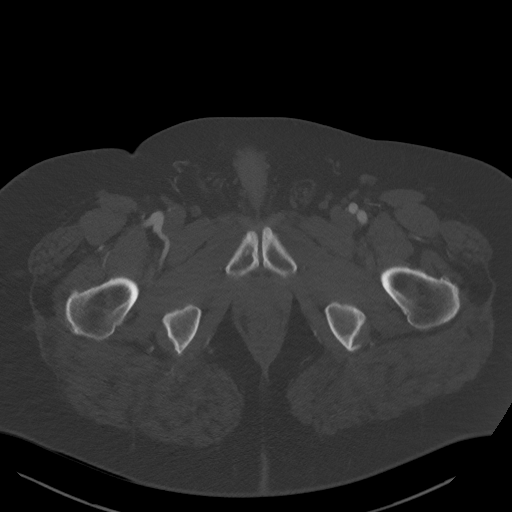
[im 31/226  soft-tissue]
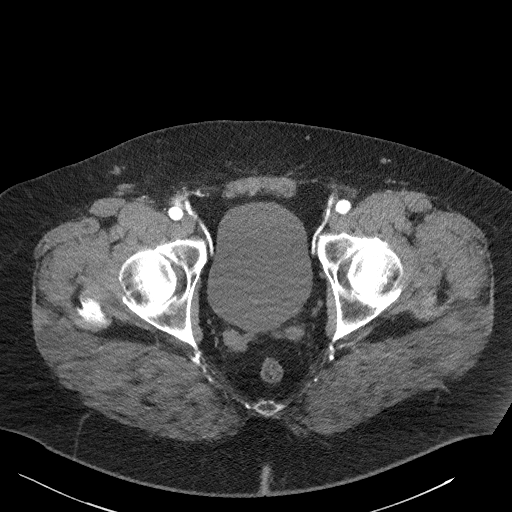
[im 61/226  soft-tissue]
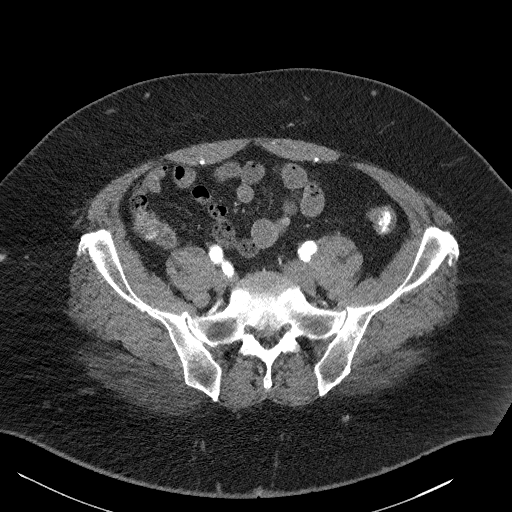
[im 76/226  soft-tissue]
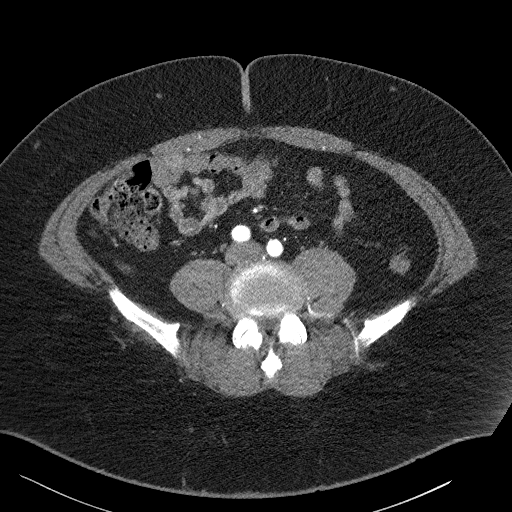
[im 91/226  soft-tissue]
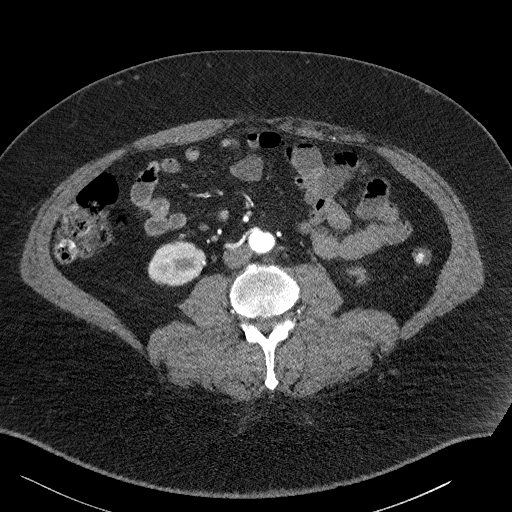
[im 121/226  soft-tissue]
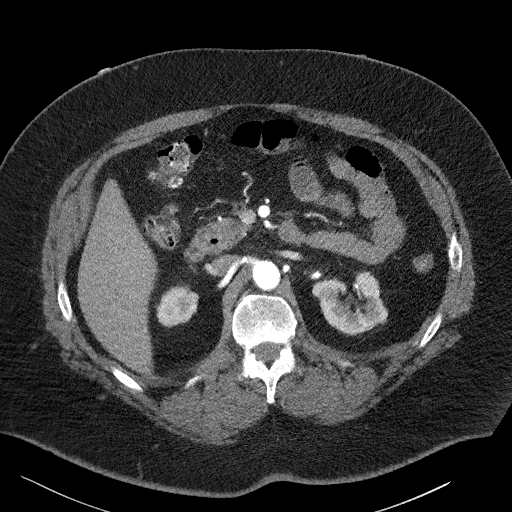
[im 136/226  soft-tissue]
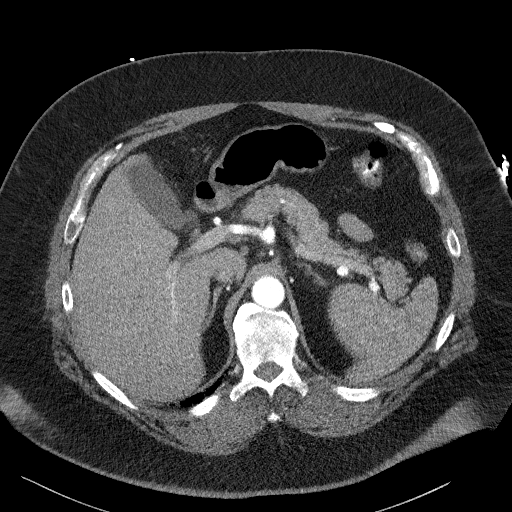
[im 151/226  soft-tissue]
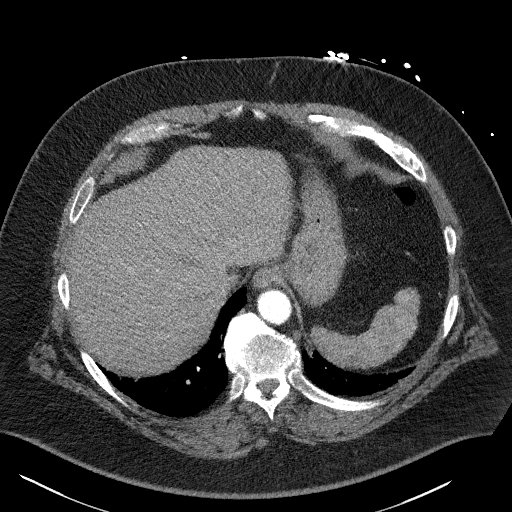
[im 166/226  soft-tissue]
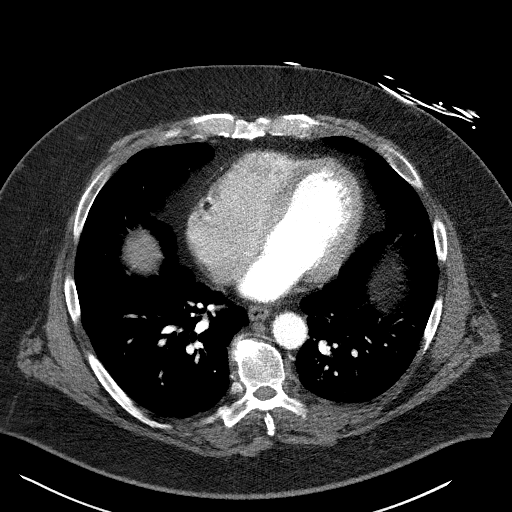
[im 166/226  bone]
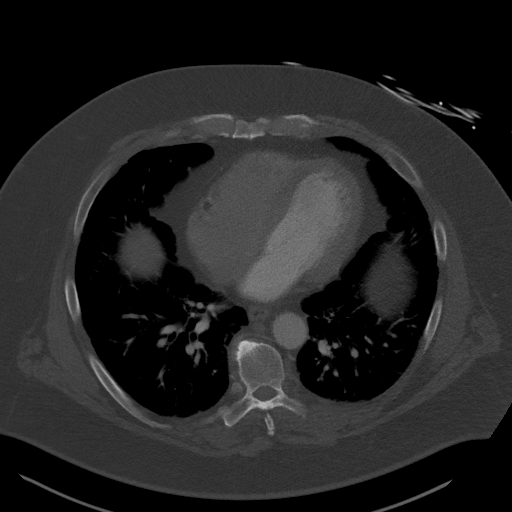
[im 196/226  soft-tissue]
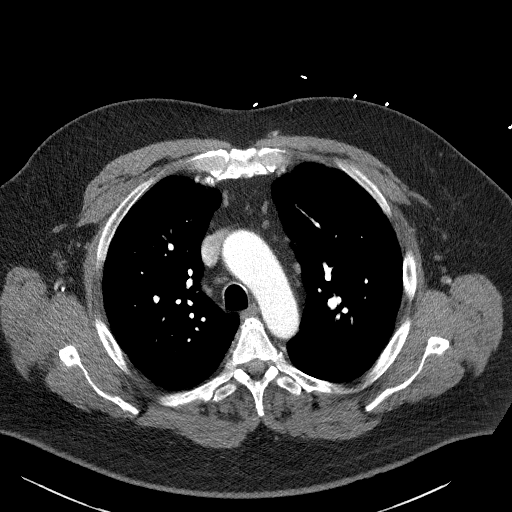
[im 211/226  soft-tissue]
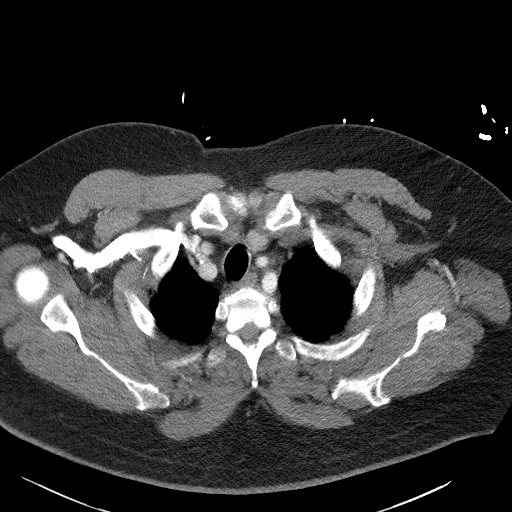

[Series 9: coronals · coronal · 0.93mm/px · 3 of 167 slices shown]
[im 42/167  soft-tissue]
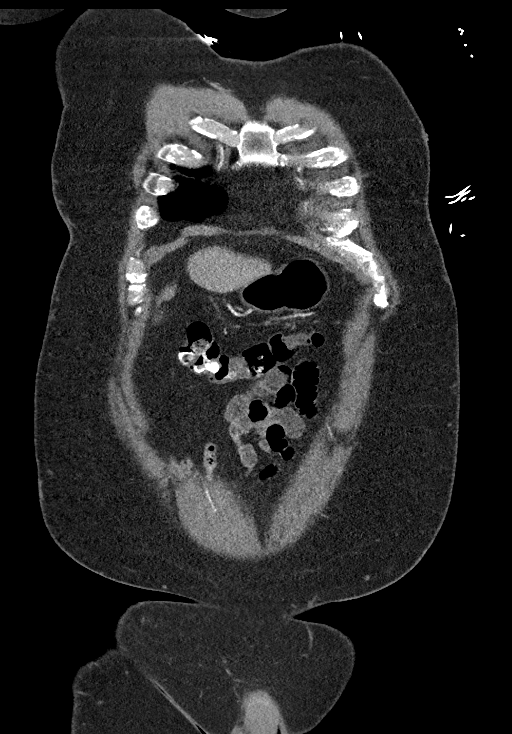
[im 84/167  soft-tissue]
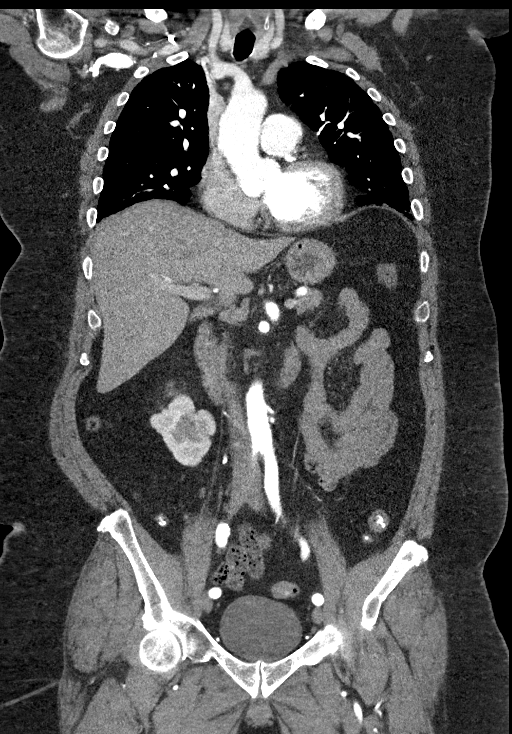
[im 125/167  soft-tissue]
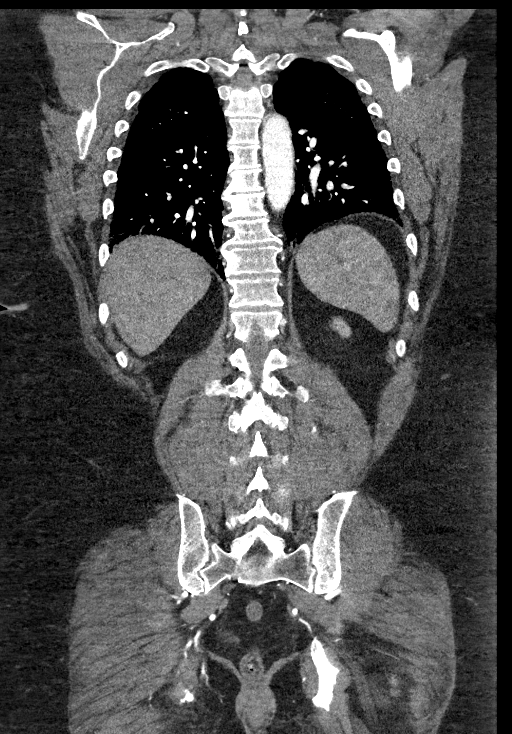

[14 of 46 positions shown; findings below may reference images not displayed]

FINDINGS: CTA CHEST FINDINGS

Cardiovascular: Ascending thoracic aorta measures 3.9 cm diameter.
No thoracic aortic dissection.

Heart size is normal. No pericardial effusion. Coronary artery
calcifications versus stent within the left anterior descending
coronary artery.

Mediastinum/Nodes: No mass or enlarged lymph nodes seen within the
mediastinum or perihilar regions. Scattered small lymph nodes.
Esophagus appears normal. Trachea and central bronchi are
unremarkable.

Lungs/Pleura: Mild atelectasis at the left lung base. Lungs
otherwise clear.

Musculoskeletal: Mild degenerative change within the thoracic spine.
No acute or suspicious osseous finding.

Review of the MIP images confirms the above findings.

CTA ABDOMEN AND PELVIS FINDINGS

VASCULAR

Aorta: Normal caliber aorta without aneurysm, dissection, vasculitis
or significant stenosis.

Celiac: Patent without evidence of aneurysm, dissection, vasculitis
or significant stenosis.

SMA: Patent without evidence of aneurysm, dissection, vasculitis or
significant stenosis.

Renals: Both renal arteries are patent without evidence of aneurysm,
dissection, vasculitis, fibromuscular dysplasia or significant
stenosis.

IMA: Patent without evidence of aneurysm, dissection, vasculitis or
significant stenosis.

Inflow: Patent without evidence of aneurysm, dissection, vasculitis
or significant stenosis.

Veins: No obvious venous abnormality within the limitations of this
arterial phase study.

Review of the MIP images confirms the above findings.

NON-VASCULAR

Hepatobiliary: No focal liver abnormality is seen. No gallstones,
gallbladder wall thickening, or biliary dilatation.

Pancreas: Unremarkable. No pancreatic ductal dilatation or
surrounding inflammatory changes.

Spleen: Normal in size without focal abnormality.

Adrenals/Urinary Tract: Adrenal glands appear normal. Kidneys are
unremarkable without mass, stone or hydronephrosis. No ureteral or
bladder calculi identified bilaterally. Bladder appears normal.

Stomach/Bowel: Bowel is normal in caliber. No bowel wall thickening
or evidence of bowel wall inflammation seen. Appendix appears
normal. Stomach is unremarkable.

Lymphatic: No enlarged lymph nodes seen.

Reproductive: Prostate is unremarkable.

Other: No free fluid or abscess collection. No free intraperitoneal
air.

Musculoskeletal: Mild degenerative change within the lumbar spine.
No acute or suspicious osseous finding.

Review of the MIP images confirms the above findings.
IMPRESSION: 1. No acute findings within the chest, abdomen or pelvis. No aortic
dissection.
2. Ascending thoracic aorta measures 3.9 cm diameter on today's
study. Recommend annual imaging followup by CTA or MRA. This
recommendation follows [id number]
ACCF/AHA/AATS/ACR/ASA/SCA/PINEAH/MARIKO/MARTEENA/ENDRIT Guidelines for the
Diagnosis and Management of Patients with Thoracic Aortic Disease.
Circulation.7515; 121: [id number]
3. Coronary artery calcifications versus stent within the LAD. Heart
size is normal. No pericardial effusion.

## 2018-10-11 ENCOUNTER — Encounter (HOSPITAL_COMMUNITY): Payer: Self-pay | Admitting: *Deleted

## 2018-10-11 ENCOUNTER — Emergency Department (HOSPITAL_COMMUNITY)
Admission: EM | Admit: 2018-10-11 | Discharge: 2018-10-12 | Disposition: A | Discharge status: Home / Self Care | Payer: 59 | Attending: Emergency Medicine | Admitting: Emergency Medicine

## 2018-10-11 ENCOUNTER — Other Ambulatory Visit: Payer: Self-pay

## 2018-10-11 ENCOUNTER — Emergency Department (HOSPITAL_COMMUNITY): Payer: 59

## 2018-10-11 DIAGNOSIS — N23 Unspecified renal colic: Secondary | ICD-10-CM | POA: Diagnosis not present

## 2018-10-11 DIAGNOSIS — N132 Hydronephrosis with renal and ureteral calculous obstruction: Secondary | ICD-10-CM | POA: Diagnosis not present

## 2018-10-11 DIAGNOSIS — R1031 Right lower quadrant pain: Secondary | ICD-10-CM | POA: Diagnosis not present

## 2018-10-11 DIAGNOSIS — I10 Essential (primary) hypertension: Secondary | ICD-10-CM | POA: Diagnosis not present

## 2018-10-11 DIAGNOSIS — I2510 Atherosclerotic heart disease of native coronary artery without angina pectoris: Secondary | ICD-10-CM | POA: Diagnosis not present

## 2018-10-11 DIAGNOSIS — R112 Nausea with vomiting, unspecified: Secondary | ICD-10-CM | POA: Diagnosis not present

## 2018-10-11 DIAGNOSIS — Z79899 Other long term (current) drug therapy: Secondary | ICD-10-CM | POA: Diagnosis not present

## 2018-10-11 DIAGNOSIS — Z7982 Long term (current) use of aspirin: Secondary | ICD-10-CM | POA: Insufficient documentation

## 2018-10-11 MED ORDER — ONDANSETRON HCL 4 MG/2ML IJ SOLN
4.0000 mg | Freq: Once | INTRAMUSCULAR | Status: AC
  Administered 2018-10-12: 4 mg via INTRAVENOUS
  Filled 2018-10-11: qty 2

## 2018-10-11 MED ORDER — MORPHINE SULFATE (PF) 4 MG/ML IV SOLN: 4.0000 mg | Freq: Once | INTRAVENOUS | Status: DC

## 2018-10-11 NOTE — ED Triage Notes (Signed)
Pt c/oright side abd pain that started yesterday with n/v, denies any fever or diarrhea,

## 2018-10-11 NOTE — ED Provider Notes (Signed)
[facility] EMERGENCY DEPARTMENT Provider Note   CSN: [id number] Arrival date & time: 10/11/18  2311    History   Chief Complaint Chief Complaint  Patient presents with  . Abdominal Pain    HPI Brett Harper is a 48 y.o. male.     Patient presents to the emergency department for evaluation of abdominal pain.  Patient reports that pain began last night.  He has been having persistent, severe right flank and lower abdominal pain associated with nausea and vomiting.  He has not had any associated diarrhea or fever.  He denies any urinary symptoms.  Has had kidney stones in the past but believes that this feels worse.     Past Medical History:  Diagnosis Date  . CAD (coronary artery disease)    DES to LAD and staged DES to PDA June 2015  . Essential hypertension   . Gout   . NSTEMI (non-ST elevated myocardial infarction) [facility])    June 2015  . Obesity   . Pulmonary sarcoidosis (HCC)    a. 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 Unstable angina (HCC) 09/04/2017  . Ascending aortic aneurysm (HCC) 11/06/2016  . Rash 04/09/2014  . Coronary atherosclerosis of native coronary artery 02/05/2014  . Dyslipidemia 02/04/2014  . Family history of coronary artery disease in father 02/04/2014  . Anxiety 02/04/2014  . NSTEMI (non-ST elevated myocardial infarction) (HCC) 02/03/2014  . Chest pain 02/02/2014  . Hypertension   . Pulmonary sarcoidosis (HCC)   . H/O inguinal hernia repair   . Gout   . Obesity- BMI 47     Past Surgical History:  Procedure Laterality Date  . INGUINAL HERNIA REPAIR    . LEFT HEART CATH AND CORONARY ANGIOGRAPHY N/A 09/04/2017   Procedure: LEFT HEART CATH AND CORONARY ANGIOGRAPHY;  Surgeon: Iran Ouch, MD;  Location: [facility] INVASIVE CV LAB;  Service: Cardiovascular;  Laterality: N/A;  . LEFT HEART CATHETERIZATION WITH CORONARY ANGIOGRAM N/A  02/03/2014   Procedure: LEFT HEART CATHETERIZATION WITH CORONARY ANGIOGRAM;  Surgeon: Iran Ouch, MD;  Location: [facility] CATH LAB;  Service: Cardiovascular;  Laterality: N/A;  . LEFT HEART CATHETERIZATION WITH CORONARY ANGIOGRAM N/A 02/04/2014   Procedure: LEFT HEART CATHETERIZATION WITH CORONARY ANGIOGRAM;  Surgeon: Kathleene Hazel, MD;  Location: [facility] CATH LAB;  Service: Cardiovascular;  Laterality: N/A;        Home Medications    Prior to Admission medications   Medication Sig Start Date End Date Taking? Authorizing Provider  aspirin EC 81 MG EC tablet Take 1 tablet (81 mg total) by mouth daily. 02/05/14   Barrett, Joline Salt, PA-C  atorvastatin (LIPITOR) 80 MG tablet Take 1 tablet (80 mg total) by mouth daily at 6 PM. 09/05/17   Barnetta Chapel, MD  lisinopril (PRINIVIL,ZESTRIL) 10 MG tablet Take 1 tablet (10 mg total) by mouth daily. 10/10/17 01/08/18  Antoine Poche, MD  metoprolol tartrate (LOPRESSOR) 50 MG tablet Take 1 tablet (50 mg total) by mouth 2 (two) times daily. 09/05/17   Barnetta Chapel, MD  nitroGLYCERIN (NITROSTAT) 0.4 MG SL tablet Place 1 tablet (0.4 mg total) under the tongue every 5 (five) minutes as needed for chest pain. 02/05/14   Barrett, Joline Salt, PA-C  pantoprazole (PROTONIX) 40 MG tablet Take 1 tablet (40 mg total) by mouth daily. 10/10/17   Antoine Poche, MD    Family History  Family History  Problem Relation Age of Onset  . Hypertension Mother   . Heart attack Father   . Diabetes Father   . Stroke Father   . Heart attack Maternal Grandfather   . Heart attack Paternal Grandfather   . Heart attack Cousin         stent placed in 5140s    Social History Social History   Tobacco Use  . Smoking status: Never Smoker  . Smokeless tobacco: Never Used  Substance Use Topics  . Alcohol use: No  . Drug use: No     Allergies   Naproxen   Review of Systems Review of Systems  Gastrointestinal: Positive for nausea and vomiting.  Genitourinary:  Positive for flank pain.  All other systems reviewed and are negative.    Physical Exam Updated Vital Signs BP (!) 187/114   Pulse 69   Temp 98.3 F (36.8 C) (Oral)   Resp 16   Ht 5\' 11"  (1.803 m)   Wt (!) 147.4 kg   SpO2 96%   BMI 45.33 kg/m   Physical Exam Vitals signs and nursing note reviewed.  Constitutional:      General: He is not in acute distress.    Appearance: Normal appearance. He is well-developed.  HENT:     Head: Normocephalic and atraumatic.     Right Ear: Hearing normal.     Left Ear: Hearing normal.     Nose: Nose normal.  Eyes:     Conjunctiva/sclera: Conjunctivae normal.     Pupils: Pupils are equal, round, and reactive to light.  Neck:     Musculoskeletal: Normal range of motion and neck supple.  Cardiovascular:     Rate and Rhythm: Regular rhythm.     Heart sounds: S1 normal and S2 normal. No murmur. No friction rub. No gallop.   Pulmonary:     Effort: Pulmonary effort is normal. No respiratory distress.     Breath sounds: Normal breath sounds.  Chest:     Chest wall: No tenderness.  Abdominal:     General: Bowel sounds are normal.     Palpations: Abdomen is soft.     Tenderness: There is abdominal tenderness in the right lower quadrant. There is right CVA tenderness. There is no guarding or rebound. Negative signs include Murphy's sign and McBurney's sign.     Hernia: No hernia is present.  Musculoskeletal: Normal range of motion.  Skin:    General: Skin is warm and dry.     Findings: No rash.  Neurological:     Mental Status: He is alert and oriented to person, place, and time.     GCS: GCS eye subscore is 4. GCS verbal subscore is 5. GCS motor subscore is 6.     Cranial Nerves: No cranial nerve deficit.     Sensory: No sensory deficit.     Coordination: Coordination normal.  Psychiatric:        Speech: Speech normal.        Behavior: Behavior normal.        Thought Content: Thought content normal.      ED Treatments / Results    Labs (all labs ordered are listed, but only abnormal results are displayed) Labs Reviewed  CBC WITH DIFFERENTIAL/PLATELET - Abnormal; Notable for the following components:      Result Value   WBC 14.3 (*)    Neutro Abs 11.9 (*)    Monocytes Absolute 1.1 (*)    All other components  within normal limits  BASIC METABOLIC PANEL  URINALYSIS, ROUTINE W REFLEX MICROSCOPIC    EKG None  Radiology Ct Renal Stone Study  Result Date: 10/12/2018 CLINICAL DATA:  RIGHT flank pain since yesterday. History of inguinal hernia repair in sarcoidosis. EXAM: CT ABDOMEN AND PELVIS WITHOUT CONTRAST TECHNIQUE: Multidetector CT imaging of the abdomen and pelvis was performed following the standard protocol without IV contrast. COMPARISON:  CT abdomen and pelvis November 30, 2014 FINDINGS: LOWER CHEST: Bibasilar atelectasis/scarring. The visualized heart size is upper limits of normal. Mild coronary artery calcification. No pericardial effusion. HEPATOBILIARY: Normal. PANCREAS: Mildly atrophic.  Nonacute. SPLEEN: Normal. ADRENALS/URINARY TRACT: Kidneys are orthotopic, demonstrating normal size; LEFT renal scarring. Moderate RIGHT hydroureteronephrosis to the level of the distal ureter where a 5 x 6 mm calculus is present. RIGHT perinephric fat stranding and trace free fluid. RIGHT lower pole nephrolithiasis measuring to 7 mm. Punctate LEFT upper pole nephrolithiasis. Limited assessment for renal masses by nonenhanced CT. The unopacified ureters are normal in course and caliber. Urinary bladder is partially distended and unremarkable. Normal adrenal glands. STOMACH/BOWEL: The stomach, small and large bowel are normal in course and caliber without inflammatory changes, sensitivity decreased by lack of enteric contrast. Mild amount of retained large bowel stool. Normal appendix. VASCULAR/LYMPHATIC: Aortoiliac vessels are normal in course and caliber. Mild calcific atherosclerosis. No lymphadenopathy by CT size criteria.  REPRODUCTIVE: Normal. OTHER: No intraperitoneal free fluid or free air. MUSCULOSKELETAL: Non-acute. Small bilateral fat containing inguinal hernias. Status post RIGHT inguinal herniorrhaphy. IMPRESSION: 1. 6 mm distal RIGHT ureteral calculus resulting in moderate obstructive uropathy and inflammatory changes. 2. Bilateral nephrolithiasis measuring to 7 mm. Aortic Atherosclerosis (ICD10-I70.0). Electronically Signed   By: Awilda Metro M.D.   On: 10/12/2018 00:45    Procedures Procedures (including critical care time)  Medications Ordered in ED Medications  morphine 4 MG/ML injection 4 mg (has no administration in time range)  tamsulosin (FLOMAX) capsule 0.4 mg (has no administration in time range)  ketorolac (TORADOL) 30 MG/ML injection (has no administration in time range)  tamsulosin (FLOMAX) 0.4 MG capsule (has no administration in time range)  ondansetron (ZOFRAN) injection 4 mg (4 mg Intravenous Given 10/12/18 0107)  ketorolac (TORADOL) 30 MG/ML injection 30 mg (30 mg Intravenous Given 10/12/18 0107)     Initial Impression / Assessment and Plan / ED Course  I have reviewed the triage vital signs and the nursing notes.  Pertinent labs & imaging results that were available during my care of the patient were reviewed by me and considered in my medical decision making (see chart for details).        Patient presents to the emergency department for evaluation of right-sided abdominal and flank pain.  Work-up reveals that this is renal colic secondary to a distal ureteral stone.  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  Will prescribe outpatient Toradol, Zofran, Flomax.  Return as needed.  Final Clinical Impressions(s) / ED Diagnoses   Final diagnoses:  Renal colic on right side    ED Discharge Orders    None       Arrayah Connors, Canary Brim, MD 10/12/18 0110

## 2018-10-12 DIAGNOSIS — N132 Hydronephrosis with renal and ureteral calculous obstruction: Secondary | ICD-10-CM | POA: Diagnosis not present

## 2018-10-12 LAB — URINALYSIS, ROUTINE W REFLEX MICROSCOPIC
Bacteria, UA: NONE SEEN
Bilirubin Urine: NEGATIVE
GLUCOSE, UA: NEGATIVE mg/dL
Ketones, ur: NEGATIVE mg/dL
Leukocytes,Ua: NEGATIVE
Nitrite: NEGATIVE
PH: 6 (ref 5.0–8.0)
Protein, ur: NEGATIVE mg/dL
Specific Gravity, Urine: 1.014 (ref 1.005–1.030)

## 2018-10-12 LAB — CBC WITH DIFFERENTIAL/PLATELET
Abs Immature Granulocytes: 0.06 10*3/uL (ref 0.00–0.07)
BASOS ABS: 0 10*3/uL (ref 0.0–0.1)
Basophils Relative: 0 %
Eosinophils Absolute: 0.2 10*3/uL (ref 0.0–0.5)
Eosinophils Relative: 1 %
HCT: 42.2 % (ref 39.0–52.0)
Hemoglobin: 13.6 g/dL (ref 13.0–17.0)
Immature Granulocytes: 0 %
Lymphocytes Relative: 7 %
Lymphs Abs: 1 10*3/uL (ref 0.7–4.0)
MCH: 28 pg (ref 26.0–34.0)
MCHC: 32.2 g/dL (ref 30.0–36.0)
MCV: 86.8 fL (ref 80.0–100.0)
Monocytes Absolute: 1.1 10*3/uL — ABNORMAL HIGH (ref 0.1–1.0)
Monocytes Relative: 8 %
Neutro Abs: 11.9 10*3/uL — ABNORMAL HIGH (ref 1.7–7.7)
Neutrophils Relative %: 84 %
Platelets: 260 10*3/uL (ref 150–400)
RBC: 4.86 MIL/uL (ref 4.22–5.81)
RDW: 12.3 % (ref 11.5–15.5)
WBC: 14.3 10*3/uL — ABNORMAL HIGH (ref 4.0–10.5)
nRBC: 0 % (ref 0.0–0.2)

## 2018-10-12 LAB — BASIC METABOLIC PANEL
Anion gap: 8 (ref 5–15)
BUN: 18 mg/dL (ref 6–20)
CO2: 24 mmol/L (ref 22–32)
CREATININE: 1.53 mg/dL — AB (ref 0.61–1.24)
Calcium: 8.8 mg/dL — ABNORMAL LOW (ref 8.9–10.3)
Chloride: 104 mmol/L (ref 98–111)
GFR calc non Af Amer: 53 mL/min — ABNORMAL LOW (ref >60)
Glucose, Bld: 110 mg/dL — ABNORMAL HIGH (ref 70–99)
Potassium: 4.6 mmol/L (ref 3.5–5.1)
Sodium: 136 mmol/L (ref 135–145)

## 2018-10-12 MED ORDER — KETOROLAC TROMETHAMINE 30 MG/ML IJ SOLN
INTRAMUSCULAR | Status: AC
  Filled 2018-10-12: qty 1

## 2018-10-12 MED ORDER — ONDANSETRON HCL 4 MG PO TABS: 4.0000 mg | ORAL_TABLET | Freq: Four times a day (QID) | ORAL | 0 refills | Status: DC | PRN

## 2018-10-12 MED ORDER — KETOROLAC TROMETHAMINE 30 MG/ML IJ SOLN
30.0000 mg | Freq: Once | INTRAMUSCULAR | Status: AC
  Administered 2018-10-12: 30 mg via INTRAVENOUS

## 2018-10-12 MED ORDER — KETOROLAC TROMETHAMINE 10 MG PO TABS: 10.0000 mg | ORAL_TABLET | Freq: Four times a day (QID) | ORAL | 0 refills | Status: DC | PRN

## 2018-10-12 MED ORDER — TAMSULOSIN HCL 0.4 MG PO CAPS
ORAL_CAPSULE | ORAL | Status: AC
  Administered 2018-10-12: 0.4 mg via ORAL
  Filled 2018-10-12: qty 1

## 2018-10-12 MED ORDER — TAMSULOSIN HCL 0.4 MG PO CAPS: 0.4000 mg | ORAL_CAPSULE | Freq: Every day | ORAL | 0 refills | Status: DC

## 2018-10-12 MED ORDER — TAMSULOSIN HCL 0.4 MG PO CAPS
0.4000 mg | ORAL_CAPSULE | ORAL | Status: AC
  Administered 2018-10-12: 0.4 mg via ORAL

## 2019-08-09 IMAGING — CT CT RENAL STONE PROTOCOL
2 of 4 series · 16 of 46 positions shown, 18 images · non-contrast
Comparison: CT abdomen and pelvis November 30, 2014

CLINICAL DATA: RIGHT flank pain since yesterday. History of
inguinal hernia repair in sarcoidosis.

EXAM:
CT ABDOMEN AND PELVIS WITHOUT CONTRAST
TECHNIQUE: Multidetector CT imaging of the abdomen and pelvis was performed
following the standard protocol without IV contrast.

[Series 2: axial st · axial · 0.92mm/px · z∈[+679,+1159]mm · 13 of 106 slices shown, 15 images]
[im 5/106  soft-tissue]
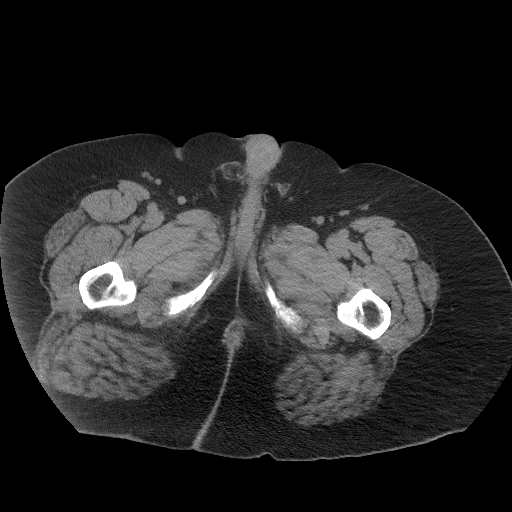
[im 5/106  bone]
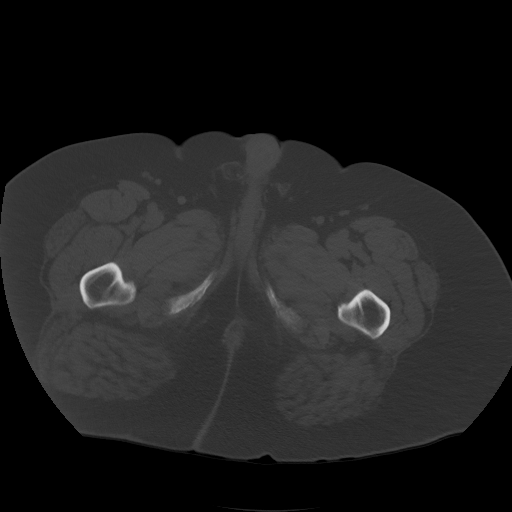
[im 14/106  soft-tissue]
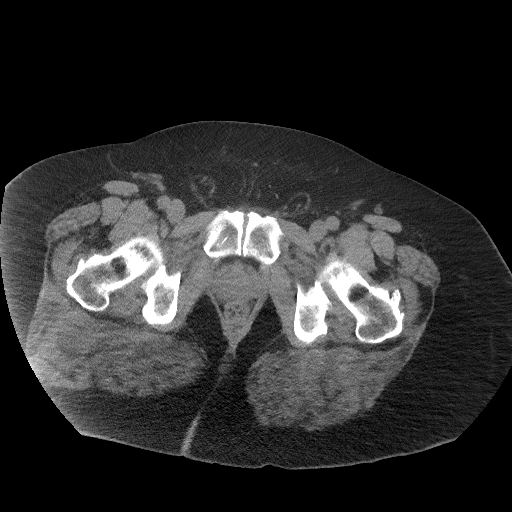
[im 22/106  soft-tissue]
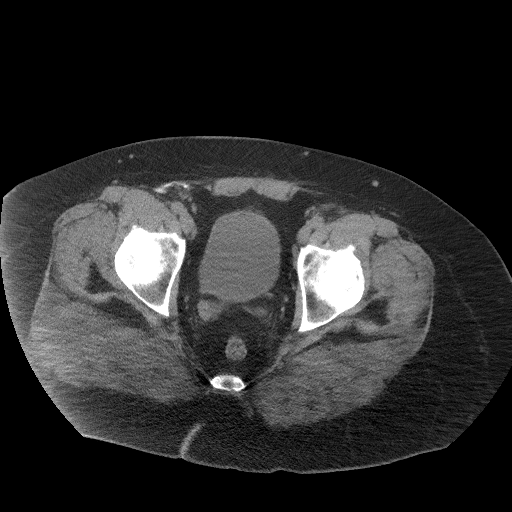
[im 31/106  soft-tissue]
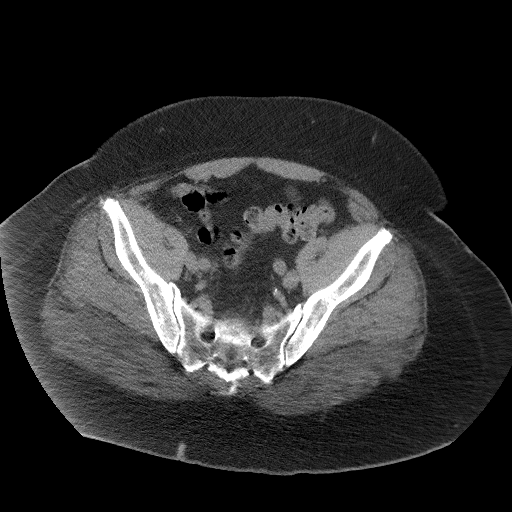
[im 36/106  soft-tissue]
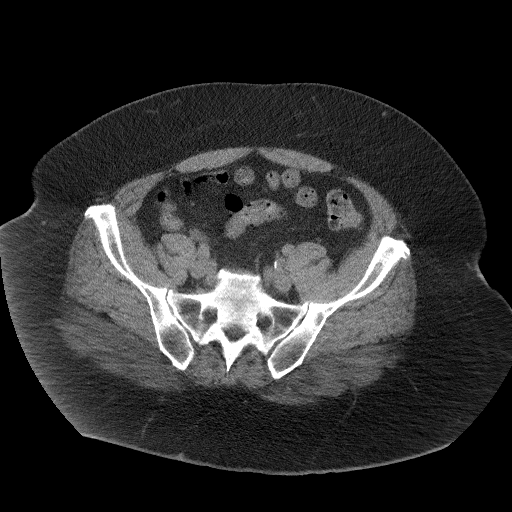
[im 44/106  soft-tissue]
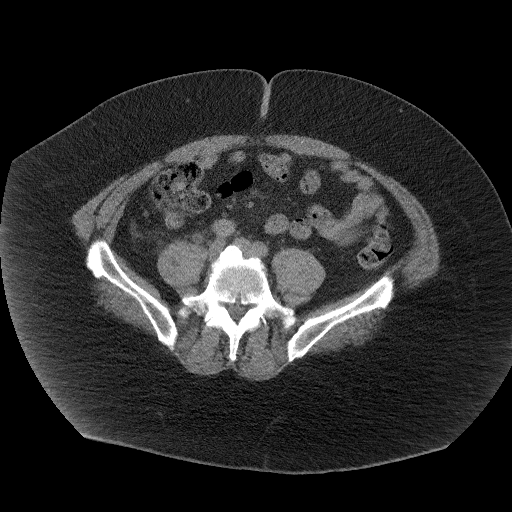
[im 53/106  soft-tissue]
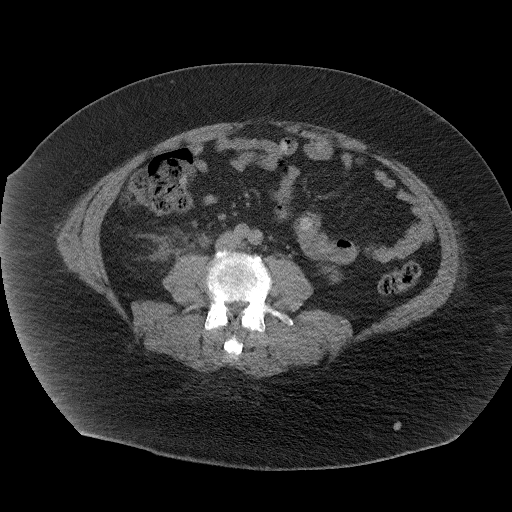
[im 62/106  soft-tissue]
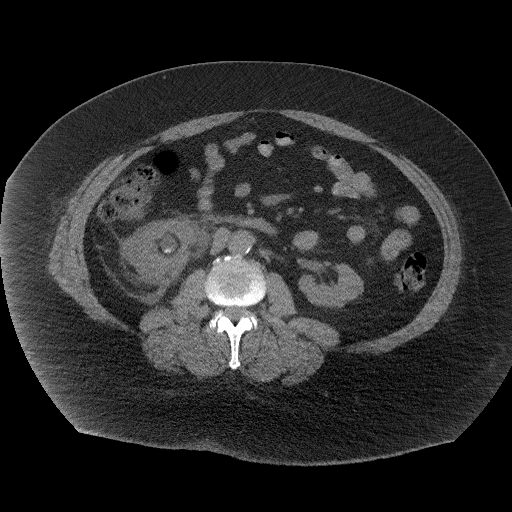
[im 71/106  soft-tissue]
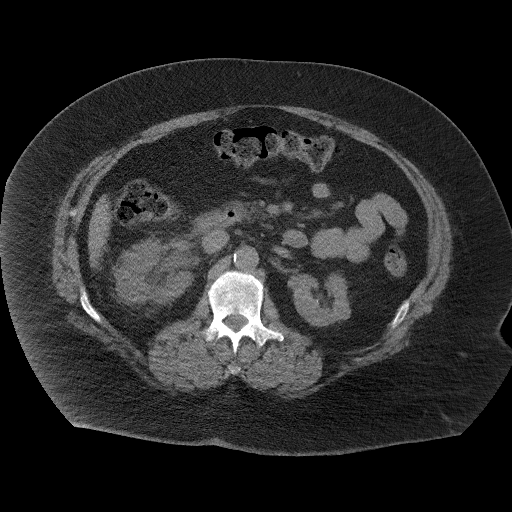
[im 71/106  bone]
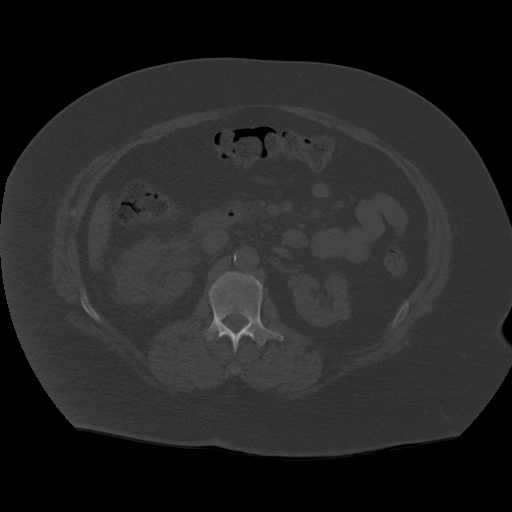
[im 75/106  soft-tissue]
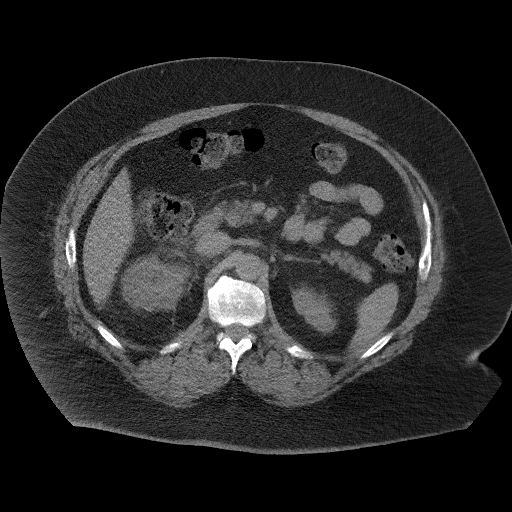
[im 84/106  soft-tissue]
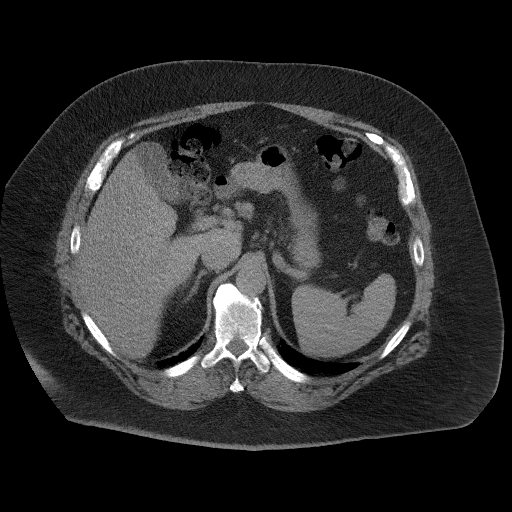
[im 92/106  soft-tissue]
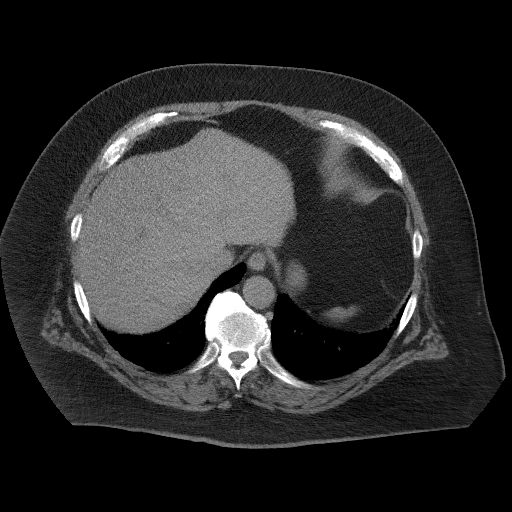
[im 101/106  soft-tissue]
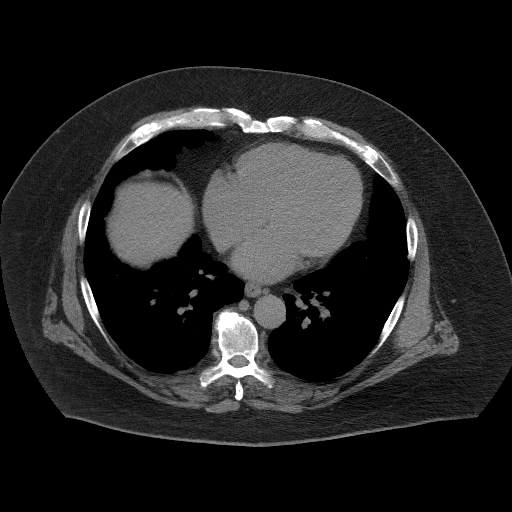

[Series 5: coronal st · coronal · 1.03mm/px · 3 of 132 slices shown]
[im 44/132  soft-tissue]
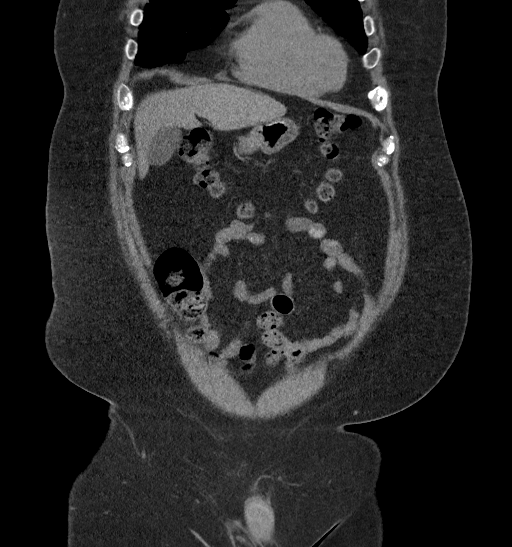
[im 59/132  soft-tissue]
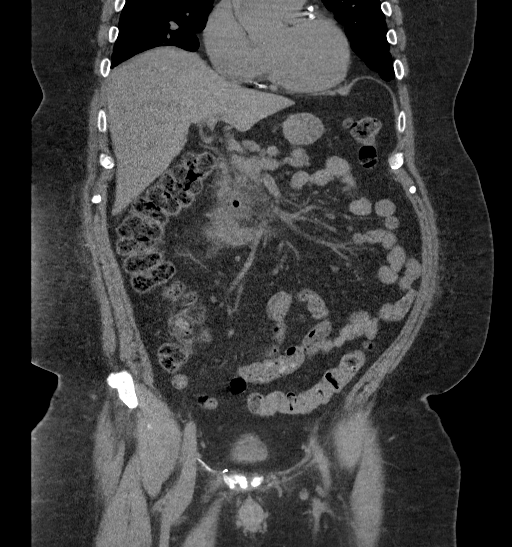
[im 73/132  soft-tissue]
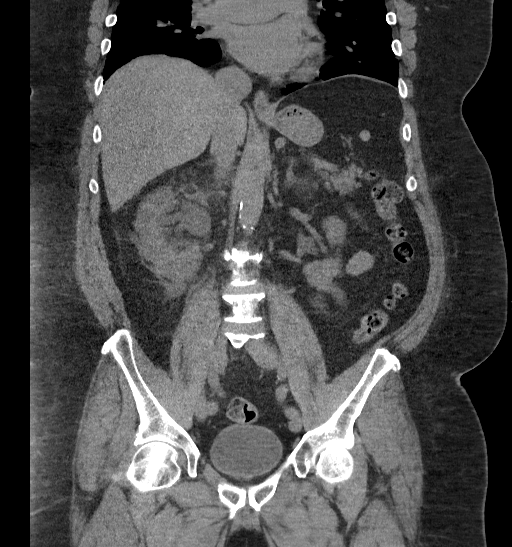

[16 of 46 positions shown; findings below may reference images not displayed]

FINDINGS: LOWER CHEST: Bibasilar atelectasis/scarring. The visualized heart
size is upper limits of normal. Mild coronary artery calcification.
No pericardial effusion.

HEPATOBILIARY: Normal.

PANCREAS: Mildly atrophic.  Nonacute.

SPLEEN: Normal.

ADRENALS/URINARY TRACT: Kidneys are orthotopic, demonstrating normal
size; LEFT renal scarring. Moderate RIGHT hydroureteronephrosis to
the level of the distal ureter where a 5 x 6 mm calculus is present.
RIGHT perinephric fat stranding and trace free fluid. RIGHT lower
pole nephrolithiasis measuring to 7 mm. Punctate LEFT upper pole
nephrolithiasis. Limited assessment for renal masses by nonenhanced
CT. The unopacified ureters are normal in course and caliber.
Urinary bladder is partially distended and unremarkable. Normal
adrenal glands.

STOMACH/BOWEL: The stomach, small and large bowel are normal in
course and caliber without inflammatory changes, sensitivity
decreased by lack of enteric contrast. Mild amount of retained large
bowel stool. Normal appendix.

VASCULAR/LYMPHATIC: Aortoiliac vessels are normal in course and
caliber. Mild calcific atherosclerosis. No lymphadenopathy by CT
size criteria.

REPRODUCTIVE: Normal.

OTHER: No intraperitoneal free fluid or free air.

MUSCULOSKELETAL: Non-acute. Small bilateral fat containing inguinal
hernias. Status post RIGHT inguinal herniorrhaphy.
IMPRESSION: 1. 6 mm distal RIGHT ureteral calculus resulting in moderate
obstructive uropathy and inflammatory changes.
2. Bilateral nephrolithiasis measuring to 7 mm.

Aortic Atherosclerosis ([id number]).

## 2019-08-10 ENCOUNTER — Emergency Department (HOSPITAL_COMMUNITY): Payer: 59

## 2019-08-10 ENCOUNTER — Encounter (HOSPITAL_COMMUNITY): Payer: Self-pay | Admitting: Emergency Medicine

## 2019-08-10 ENCOUNTER — Emergency Department (HOSPITAL_COMMUNITY)
Admission: EM | Admit: 2019-08-10 | Discharge: 2019-08-10 | Disposition: A | Discharge status: Home / Self Care | Payer: 59 | Attending: Emergency Medicine | Admitting: Emergency Medicine

## 2019-08-10 ENCOUNTER — Other Ambulatory Visit: Payer: Self-pay

## 2019-08-10 DIAGNOSIS — Z7982 Long term (current) use of aspirin: Secondary | ICD-10-CM | POA: Insufficient documentation

## 2019-08-10 DIAGNOSIS — R05 Cough: Secondary | ICD-10-CM | POA: Diagnosis present

## 2019-08-10 DIAGNOSIS — R059 Cough, unspecified: Secondary | ICD-10-CM

## 2019-08-10 DIAGNOSIS — E782 Mixed hyperlipidemia: Secondary | ICD-10-CM | POA: Diagnosis not present

## 2019-08-10 DIAGNOSIS — I2510 Atherosclerotic heart disease of native coronary artery without angina pectoris: Secondary | ICD-10-CM | POA: Diagnosis not present

## 2019-08-10 DIAGNOSIS — I252 Old myocardial infarction: Secondary | ICD-10-CM | POA: Insufficient documentation

## 2019-08-10 DIAGNOSIS — Z9861 Coronary angioplasty status: Secondary | ICD-10-CM | POA: Insufficient documentation

## 2019-08-10 DIAGNOSIS — I10 Essential (primary) hypertension: Secondary | ICD-10-CM | POA: Diagnosis not present

## 2019-08-10 DIAGNOSIS — J189 Pneumonia, unspecified organism: Secondary | ICD-10-CM

## 2019-08-10 DIAGNOSIS — Z79899 Other long term (current) drug therapy: Secondary | ICD-10-CM | POA: Insufficient documentation

## 2019-08-10 DIAGNOSIS — Z886 Allergy status to analgesic agent status: Secondary | ICD-10-CM | POA: Diagnosis not present

## 2019-08-10 DIAGNOSIS — U071 COVID-19: Secondary | ICD-10-CM | POA: Diagnosis not present

## 2019-08-10 DIAGNOSIS — J1289 Other viral pneumonia: Secondary | ICD-10-CM | POA: Diagnosis not present

## 2019-08-10 DIAGNOSIS — R0602 Shortness of breath: Secondary | ICD-10-CM

## 2019-08-10 LAB — BASIC METABOLIC PANEL
Anion gap: 12 (ref 5–15)
BUN: 11 mg/dL (ref 6–20)
CO2: 27 mmol/L (ref 22–32)
Calcium: 8.9 mg/dL (ref 8.9–10.3)
Chloride: 99 mmol/L (ref 98–111)
Creatinine, Ser: 1.05 mg/dL (ref 0.61–1.24)
GFR calc Af Amer: >60 mL/min (ref >60)
GFR calc non Af Amer: >60 mL/min (ref >60)
Glucose, Bld: 107 mg/dL — ABNORMAL HIGH (ref 70–99)
Potassium: 4.1 mmol/L (ref 3.5–5.1)
Sodium: 138 mmol/L (ref 135–145)

## 2019-08-10 LAB — CBC
HCT: 48.5 % (ref 39.0–52.0)
Hemoglobin: 15.8 g/dL (ref 13.0–17.0)
MCH: 28.4 pg (ref 26.0–34.0)
MCHC: 32.6 g/dL (ref 30.0–36.0)
MCV: 87.1 fL (ref 80.0–100.0)
Platelets: 234 10*3/uL (ref 150–400)
RBC: 5.57 MIL/uL (ref 4.22–5.81)
RDW: 12.2 % (ref 11.5–15.5)
WBC: 6.6 10*3/uL (ref 4.0–10.5)
nRBC: 0 % (ref 0.0–0.2)

## 2019-08-10 LAB — TROPONIN I (HIGH SENSITIVITY)
Troponin I (High Sensitivity): 17 ng/L (ref <18)
Troponin I (High Sensitivity): 14 ng/L (ref <18)

## 2019-08-10 MED ORDER — DOXYCYCLINE HYCLATE 100 MG PO TABS
100.0000 mg | ORAL_TABLET | Freq: Once | ORAL | Status: AC
  Administered 2019-08-10: 100 mg via ORAL
  Filled 2019-08-10: qty 1

## 2019-08-10 MED ORDER — SODIUM CHLORIDE 0.9% FLUSH: 3.0000 mL | Freq: Once | INTRAVENOUS | Status: DC

## 2019-08-10 MED ORDER — DOXYCYCLINE HYCLATE 100 MG PO CAPS: 100.0000 mg | ORAL_CAPSULE | Freq: Two times a day (BID) | ORAL | 0 refills | Status: DC

## 2019-08-10 MED ORDER — CYCLOBENZAPRINE HCL 10 MG PO TABS: 10.0000 mg | ORAL_TABLET | Freq: Two times a day (BID) | ORAL | 0 refills | Status: AC | PRN

## 2019-08-10 MED ORDER — METHOCARBAMOL 500 MG PO TABS
1000.0000 mg | ORAL_TABLET | Freq: Once | ORAL | Status: AC
  Administered 2019-08-10: 17:00:00 1000 mg via ORAL
  Filled 2019-08-10: qty 2

## 2019-08-10 MED ORDER — ALBUTEROL SULFATE HFA 108 (90 BASE) MCG/ACT IN AERS
2.0000 | INHALATION_SPRAY | Freq: Once | RESPIRATORY_TRACT | Status: AC
  Administered 2019-08-10: 2 via RESPIRATORY_TRACT
  Filled 2019-08-10: qty 6.7

## 2019-08-10 NOTE — ED Notes (Signed)
Accidentally had pt sign the transfer consent instead of discharge.  Pt verbalized understanding of d/c instructions.

## 2019-08-10 NOTE — ED Triage Notes (Signed)
Cough and short of breath x 5 days

## 2019-08-10 NOTE — ED Notes (Signed)
Pt ambulated from triage without difficulty, sats 96% on room air

## 2019-08-10 NOTE — Discharge Instructions (Addendum)
Take Doxycycline twice daily for one week for infection Use inhaler as needed for shortness of breath Take Flexeril as needed for muscle spasms and cramping Please return if worsening

## 2019-08-10 NOTE — ED Provider Notes (Signed)
[facility] EMERGENCY DEPARTMENT Provider Note   CSN: [id number] Arrival date & time: 08/10/19  1103     History Chief Complaint  Patient presents with  . Cough    Brett Harper is a 49 y.o. male with history of CAD, pulmonary sarcoidosis, and obesity who presents with cough and rib pain. Symptoms have been ongoing for the past 5 days and initially started with a cough. He reports cough is productive of yellow/green sputum. 2 days ago he started to have bilateral rib pain which is worse with coughing. Today he started to get more SOB with exertion so he decided to come to the ED. He states symptoms do not feel like when he had unstable angina or an NSTEMI. He denies fever, chills, sweats, body aches, URI symptoms. No close COVID contacts. His mom had sinusitis but denies similar symptoms. He also has decreased oral intake because his abdomen cramps when he eats but has not had N/V/D. He does not smoke.  HPI     Past Medical History:  Diagnosis Date  . CAD (coronary artery disease)    DES to LAD and staged DES to PDA June 2015  . Essential hypertension   . Gout   . NSTEMI (non-ST elevated myocardial infarction) [facility])    June 2015  . Obesity   . Pulmonary sarcoidosis ([place])    a. 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 Unstable angina ([place]) 09/04/2017  . Ascending aortic aneurysm ([place]) 11/06/2016  . Rash 04/09/2014  . Coronary atherosclerosis of native coronary artery 02/05/2014  . Dyslipidemia 02/04/2014  . Family history of coronary artery disease in father 02/04/2014  . Anxiety 02/04/2014  . NSTEMI (non-ST elevated myocardial infarction) ([place]) 02/03/2014  . Chest pain 02/02/2014  . Hypertension   . Pulmonary sarcoidosis ([place])   . H/O inguinal hernia repair   . Gout   . Obesity- BMI 47     Past Surgical History:  Procedure Laterality Date  . INGUINAL HERNIA  REPAIR    . LEFT HEART CATH AND CORONARY ANGIOGRAPHY N/A 09/04/2017   Procedure: LEFT HEART CATH AND CORONARY ANGIOGRAPHY;  Surgeon: Wellington Hampshire, MD;  Location: [place] CV LAB;  Service: Cardiovascular;  Laterality: N/A;  . LEFT HEART CATHETERIZATION WITH CORONARY ANGIOGRAM N/A 02/03/2014   Procedure: LEFT HEART CATHETERIZATION WITH CORONARY ANGIOGRAM;  Surgeon: Wellington Hampshire, MD;  Location: [facility] CATH LAB;  Service: Cardiovascular;  Laterality: N/A;  . LEFT HEART CATHETERIZATION WITH CORONARY ANGIOGRAM N/A 02/04/2014   Procedure: LEFT HEART CATHETERIZATION WITH CORONARY ANGIOGRAM;  Surgeon: Burnell Blanks, MD;  Location: [facility] CATH LAB;  Service: Cardiovascular;  Laterality: N/A;       Family History  Problem Relation Age of Onset  . Hypertension Mother   . Heart attack Father   . Diabetes Father   . Stroke Father   . Heart attack Maternal Grandfather   . Heart attack Paternal Grandfather   . Heart attack Cousin         stent placed in 47s    Social History   Tobacco Use  . Smoking status: Never Smoker  . Smokeless tobacco: Never Used  Substance Use Topics  . Alcohol use: No  . Drug use: No    Home Medications Prior to Admission medications   Medication Sig Start Date End Date Taking? Authorizing Provider  aspirin EC 81 MG EC tablet  Take 1 tablet (81 mg total) by mouth daily. 02/05/14   Barrett, Joline Salt, PA-C  atorvastatin (LIPITOR) 80 MG tablet Take 1 tablet (80 mg total) by mouth daily at 6 PM. 09/05/17   Barnetta Chapel, MD  ketorolac (TORADOL) 10 MG tablet Take 1 tablet (10 mg total) by mouth every 6 (six) hours as needed for moderate pain or severe pain. 10/12/18   Gilda Crease, MD  lisinopril (PRINIVIL,ZESTRIL) 10 MG tablet Take 1 tablet (10 mg total) by mouth daily. 10/10/17 01/08/18  Antoine Poche, MD  metoprolol tartrate (LOPRESSOR) 50 MG tablet Take 1 tablet (50 mg total) by mouth 2 (two) times daily. 09/05/17   Barnetta Chapel, MD    nitroGLYCERIN (NITROSTAT) 0.4 MG SL tablet Place 1 tablet (0.4 mg total) under the tongue every 5 (five) minutes as needed for chest pain. 02/05/14   Barrett, Joline Salt, PA-C  ondansetron (ZOFRAN) 4 MG tablet Take 1 tablet (4 mg total) by mouth every 6 (six) hours as needed for nausea or vomiting. 10/12/18   Gilda Crease, MD  pantoprazole (PROTONIX) 40 MG tablet Take 1 tablet (40 mg total) by mouth daily. 10/10/17   Antoine Poche, MD  tamsulosin (FLOMAX) 0.4 MG CAPS capsule Take 1 capsule (0.4 mg total) by mouth daily. 10/12/18   Gilda Crease, MD    Allergies    Naproxen  Review of Systems   Review of Systems  Constitutional: Negative for chills and fever.  HENT: Negative for congestion and sore throat.   Respiratory: Positive for cough and shortness of breath. Negative for chest tightness and wheezing.   Cardiovascular: Positive for chest pain.  Gastrointestinal: Positive for abdominal pain. Negative for diarrhea, nausea and vomiting.  Neurological: Negative for headaches.  All other systems reviewed and are negative.   Physical Exam Updated Vital Signs BP (!) 151/121 (BP Location: Right Arm)   Pulse 96   Temp 99 F (37.2 C) (Oral)   Resp 18   Ht 5\' 11"  (1.803 m)   Wt (!) 147.4 kg   SpO2 95%   BMI 45.33 kg/m   Physical Exam Vitals and nursing note reviewed.  Constitutional:      General: He is not in acute distress.    Appearance: He is well-developed. He is obese. He is not ill-appearing.  HENT:     Head: Normocephalic and atraumatic.     Right Ear: Tympanic membrane normal.     Left Ear: Tympanic membrane normal.     Mouth/Throat:     Mouth: Mucous membranes are moist.  Eyes:     General: No scleral icterus.       Right eye: No discharge.        Left eye: No discharge.     Conjunctiva/sclera: Conjunctivae normal.     Pupils: Pupils are equal, round, and reactive to light.  Cardiovascular:     Rate and Rhythm: Normal rate and regular rhythm.   Pulmonary:     Effort: Pulmonary effort is normal. No respiratory distress.     Breath sounds: Normal breath sounds.  Abdominal:     General: There is no distension.     Palpations: Abdomen is soft.     Tenderness: There is no abdominal tenderness.  Musculoskeletal:     Cervical back: Normal range of motion.     Right lower leg: No edema.     Left lower leg: No edema.  Skin:    General: Skin is warm  and dry.  Neurological:     Mental Status: He is alert and oriented to person, place, and time.  Psychiatric:        Behavior: Behavior normal.     ED Results / Procedures / Treatments   Labs (all labs ordered are listed, but only abnormal results are displayed) Labs Reviewed  BASIC METABOLIC PANEL - Abnormal; Notable for the following components:      Result Value   Glucose, Bld 107 (*)    All other components within normal limits  NOVEL CORONAVIRUS, NAA (HOSP ORDER, SEND-OUT TO REF LAB; TAT 18-24 HRS)  CBC  TROPONIN I (HIGH SENSITIVITY)  TROPONIN I (HIGH SENSITIVITY)    EKG EKG Interpretation  Date/Time:  Monday August 10 2019 16:49:07 EST Ventricular Rate:  84 PR Interval:    QRS Duration: 89 QT Interval:  353 QTC Calculation: 418 R Axis:   27 Text Interpretation: Sinus rhythm No STEMI Confirmed by Alona Bene [phone]) on 08/10/2019 4:50:29 PM   Radiology DG Chest Port 1 View  Result Date: 08/10/2019 CLINICAL DATA:  Cough shortness of breath history of hypertension EXAM: PORTABLE CHEST 1 VIEW COMPARISON:  09/03/2017 FINDINGS: Cardiomediastinal contours are stable. Lung volumes are low. Subtle opacification in the left mid chest some extent may have been present previously appearing more pronounced on today's study. No signs of pleural effusion. No acute bone finding. IMPRESSION: Low volume chest with increased prominence of interstitium particularly in the left chest, may represent perihilar atelectasis or developing infection. Electronically Signed   By: Donzetta Kohut M.D.   On: 08/10/2019 18:31    Procedures Procedures (including critical care time)  Medications Ordered in ED Medications  sodium chloride flush (NS) 0.9 % injection 3 mL (has no administration in time range)  methocarbamol (ROBAXIN) tablet 1,000 mg (1,000 mg Oral Given 08/10/19 1708)  doxycycline (VIBRA-TABS) tablet 100 mg (100 mg Oral Given 08/10/19 1940)  albuterol (VENTOLIN HFA) 108 (90 Base) MCG/ACT inhaler 2 puff (2 puffs Inhalation Given 08/10/19 1940)    ED Course  I have reviewed the triage vital signs and the nursing notes.  Pertinent labs & imaging results that were available during my care of the patient were reviewed by me and considered in my medical decision making (see chart for details).  49 year old male presents with cough, SOB, rib pain which has been worsening over the past 5 days. On exam he is obese and mildly uncomfortable appearing. Vitals are reassuring. EKG is SR. Labs are reassuring. CXR is pending. Will order a muscle relaxer and reassess.  CXR shows possible developing pneumonia. Doubt ACS or PE. Will cover for CAP although there is concern for COVID as well. COVID test was obtained. He was able to ambulate with normal sats. Will d/c with antibiotics, albuterol, and muscle relaxer. Return precautions discussed.  Brett Harper was evaluated in Emergency Department on 08/10/2019 for the symptoms described in the history of present illness. He was evaluated in the context of the global COVID-19 pandemic, which necessitated consideration that the patient might be at risk for infection with the SARS-CoV-2 virus that causes COVID-19. Institutional protocols and algorithms that pertain to the evaluation of patients at risk for COVID-19 are in a state of rapid change based on information released by regulatory bodies including the CDC and federal and state organizations. These policies and algorithms were followed during the patient's care in the ED.   MDM  Rules/Calculators/A&P  Final Clinical Impression(s) / ED Diagnoses Final diagnoses:  SOB (shortness of breath)  Cough    Rx / DC Orders ED Discharge Orders    None       Bethel Born, PA-C 08/10/19 2028    Maia Plan, MD 08/13/19 [phone]

## 2019-08-12 LAB — NOVEL CORONAVIRUS, NAA (HOSP ORDER, SEND-OUT TO REF LAB; TAT 18-24 HRS): SARS-CoV-2, NAA: DETECTED — AB

## 2019-08-14 ENCOUNTER — Telehealth: Payer: Self-pay | Admitting: *Deleted

## 2019-08-14 ENCOUNTER — Telehealth: Payer: Self-pay

## 2019-08-14 NOTE — Telephone Encounter (Signed)
Pt notified that his test result for covid 19 is positive. He voiced understanding. Pt advised to quarantine for 10 to 14 days. May come out of quarantine if no fever without taking antipyretics (fever medicine) and symptoms have decreased at 10 days. Temperature should be within normal limits. Reviewed signs and symptoms of covid. He stated he went to the ED and was diagnosed with pneumonia. He was put on a antibiotic. And now he feels so much better.  Advised with pneumonia, he should be mindful of any problems breathing. and call 911 for any respiratory distress.  You should treat symptoms with OTC medications. You should also notify your PCP. Get fresh air, hydrated and get rest. Should be able to go outside your house, alone.  Remember to wear your mask, stay at home, social distant and clean the hard surfaces in your house. And all family members if they have not been tested, should be tested. Pt voiced understanding. Will notify the [facility] Department.

## 2021-04-18 ENCOUNTER — Encounter (HOSPITAL_COMMUNITY): Payer: Self-pay | Admitting: Emergency Medicine

## 2022-01-26 ENCOUNTER — Other Ambulatory Visit (HOSPITAL_COMMUNITY): Payer: Self-pay | Admitting: *Deleted

## 2022-01-26 ENCOUNTER — Observation Stay (HOSPITAL_COMMUNITY)
Admission: EM | Admit: 2022-01-26 | Discharge: 2022-01-27 | Disposition: A | Discharge status: Home / Self Care | Payer: 59 | Attending: Internal Medicine | Admitting: Internal Medicine

## 2022-01-26 ENCOUNTER — Encounter (HOSPITAL_COMMUNITY): Payer: Self-pay | Admitting: Emergency Medicine

## 2022-01-26 ENCOUNTER — Observation Stay (HOSPITAL_BASED_OUTPATIENT_CLINIC_OR_DEPARTMENT_OTHER): Payer: 59

## 2022-01-26 ENCOUNTER — Emergency Department (HOSPITAL_COMMUNITY): Payer: 59

## 2022-01-26 DIAGNOSIS — R918 Other nonspecific abnormal finding of lung field: Secondary | ICD-10-CM | POA: Diagnosis not present

## 2022-01-26 DIAGNOSIS — I2510 Atherosclerotic heart disease of native coronary artery without angina pectoris: Principal | ICD-10-CM | POA: Insufficient documentation

## 2022-01-26 DIAGNOSIS — R079 Chest pain, unspecified: Secondary | ICD-10-CM | POA: Diagnosis not present

## 2022-01-26 DIAGNOSIS — Z6841 Body Mass Index (BMI) 40.0 and over, adult: Secondary | ICD-10-CM | POA: Diagnosis not present

## 2022-01-26 DIAGNOSIS — I160 Hypertensive urgency: Secondary | ICD-10-CM | POA: Diagnosis not present

## 2022-01-26 DIAGNOSIS — Z79899 Other long term (current) drug therapy: Secondary | ICD-10-CM | POA: Insufficient documentation

## 2022-01-26 DIAGNOSIS — D860 Sarcoidosis of lung: Secondary | ICD-10-CM | POA: Diagnosis present

## 2022-01-26 DIAGNOSIS — E876 Hypokalemia: Secondary | ICD-10-CM | POA: Diagnosis not present

## 2022-01-26 DIAGNOSIS — Z955 Presence of coronary angioplasty implant and graft: Secondary | ICD-10-CM | POA: Insufficient documentation

## 2022-01-26 DIAGNOSIS — I5033 Acute on chronic diastolic (congestive) heart failure: Secondary | ICD-10-CM | POA: Diagnosis not present

## 2022-01-26 DIAGNOSIS — R748 Abnormal levels of other serum enzymes: Secondary | ICD-10-CM | POA: Insufficient documentation

## 2022-01-26 DIAGNOSIS — Z7982 Long term (current) use of aspirin: Secondary | ICD-10-CM | POA: Diagnosis not present

## 2022-01-26 DIAGNOSIS — I110 Hypertensive heart disease with heart failure: Secondary | ICD-10-CM | POA: Diagnosis not present

## 2022-01-26 DIAGNOSIS — E785 Hyperlipidemia, unspecified: Secondary | ICD-10-CM | POA: Diagnosis not present

## 2022-01-26 DIAGNOSIS — R072 Precordial pain: Secondary | ICD-10-CM | POA: Diagnosis present

## 2022-01-26 DIAGNOSIS — E663 Overweight: Secondary | ICD-10-CM | POA: Diagnosis not present

## 2022-01-26 LAB — BASIC METABOLIC PANEL
Anion gap: 6 (ref 5–15)
BUN: 9 mg/dL (ref 6–20)
CO2: 26 mmol/L (ref 22–32)
Calcium: 8.6 mg/dL — ABNORMAL LOW (ref 8.9–10.3)
Chloride: 105 mmol/L (ref 98–111)
Creatinine, Ser: 1.07 mg/dL (ref 0.61–1.24)
GFR, Estimated: >60 mL/min (ref >60)
Glucose, Bld: 101 mg/dL — ABNORMAL HIGH (ref 70–99)
Potassium: 3.4 mmol/L — ABNORMAL LOW (ref 3.5–5.1)
Sodium: 137 mmol/L (ref 135–145)

## 2022-01-26 LAB — CBC
HCT: 42.8 % (ref 39.0–52.0)
Hemoglobin: 13.8 g/dL (ref 13.0–17.0)
MCH: 27.8 pg (ref 26.0–34.0)
MCHC: 32.2 g/dL (ref 30.0–36.0)
MCV: 86.1 fL (ref 80.0–100.0)
Platelets: 192 10*3/uL (ref 150–400)
RBC: 4.97 MIL/uL (ref 4.22–5.81)
RDW: 14.3 % (ref 11.5–15.5)
WBC: 12.8 10*3/uL — ABNORMAL HIGH (ref 4.0–10.5)
nRBC: 0 % (ref 0.0–0.2)

## 2022-01-26 LAB — ECHOCARDIOGRAM COMPLETE
Area-P 1/2: 4.21 cm2
Height: 71 in
S' Lateral: 3.6 cm
Weight: 5016 oz

## 2022-01-26 LAB — TROPONIN I (HIGH SENSITIVITY)
Troponin I (High Sensitivity): 13 ng/L (ref <18)
Troponin I (High Sensitivity): 15 ng/L (ref <18)
Troponin I (High Sensitivity): 15 ng/L (ref <18)

## 2022-01-26 LAB — GLUCOSE, CAPILLARY: Glucose-Capillary: 89 mg/dL (ref 70–99)

## 2022-01-26 LAB — HIV ANTIBODY (ROUTINE TESTING W REFLEX): HIV Screen 4th Generation wRfx: NONREACTIVE

## 2022-01-26 LAB — BRAIN NATRIURETIC PEPTIDE: B Natriuretic Peptide: 402 pg/mL — ABNORMAL HIGH (ref 0.0–100.0)

## 2022-01-26 MED ORDER — PANTOPRAZOLE SODIUM 40 MG PO TBEC
40.0000 mg | DELAYED_RELEASE_TABLET | Freq: Every day | ORAL | Status: DC
  Administered 2022-01-26 – 2022-01-27 (×2): 40 mg via ORAL
  Filled 2022-01-26 (×2): qty 1

## 2022-01-26 MED ORDER — TAMSULOSIN HCL 0.4 MG PO CAPS
0.4000 mg | ORAL_CAPSULE | Freq: Every day | ORAL | Status: DC
  Administered 2022-01-26 – 2022-01-27 (×2): 0.4 mg via ORAL
  Filled 2022-01-26 (×2): qty 1

## 2022-01-26 MED ORDER — ACETAMINOPHEN 650 MG RE SUPP: 650.0000 mg | Freq: Four times a day (QID) | RECTAL | Status: DC | PRN

## 2022-01-26 MED ORDER — MORPHINE SULFATE (PF) 2 MG/ML IV SOLN: 1.0000 mg | INTRAVENOUS | Status: DC | PRN

## 2022-01-26 MED ORDER — LISINOPRIL 10 MG PO TABS
20.0000 mg | ORAL_TABLET | Freq: Every day | ORAL | Status: DC
  Administered 2022-01-26 – 2022-01-27 (×2): 20 mg via ORAL
  Filled 2022-01-26 (×2): qty 2

## 2022-01-26 MED ORDER — ONDANSETRON HCL 4 MG PO TABS: 4.0000 mg | ORAL_TABLET | Freq: Four times a day (QID) | ORAL | Status: DC | PRN

## 2022-01-26 MED ORDER — LISINOPRIL 10 MG PO TABS: 10.0000 mg | ORAL_TABLET | Freq: Every day | ORAL | Status: DC

## 2022-01-26 MED ORDER — KETOROLAC TROMETHAMINE 10 MG PO TABS: 10.0000 mg | ORAL_TABLET | Freq: Four times a day (QID) | ORAL | Status: DC | PRN

## 2022-01-26 MED ORDER — CYCLOBENZAPRINE HCL 10 MG PO TABS
10.0000 mg | ORAL_TABLET | Freq: Two times a day (BID) | ORAL | Status: DC | PRN
Start: 2022-01-26 — End: 2022-01-27

## 2022-01-26 MED ORDER — PERFLUTREN LIPID MICROSPHERE
1.0000 mL | INTRAVENOUS | Status: AC | PRN
  Administered 2022-01-26: 4 mL via INTRAVENOUS

## 2022-01-26 MED ORDER — TICAGRELOR 90 MG PO TABS
90.0000 mg | ORAL_TABLET | Freq: Two times a day (BID) | ORAL | Status: DC
  Administered 2022-01-26 – 2022-01-27 (×3): 90 mg via ORAL
  Filled 2022-01-26 (×3): qty 1

## 2022-01-26 MED ORDER — HYDROMORPHONE HCL 1 MG/ML IJ SOLN
1.0000 mg | Freq: Once | INTRAMUSCULAR | Status: AC
  Administered 2022-01-26: 1 mg via INTRAVENOUS
  Filled 2022-01-26: qty 1

## 2022-01-26 MED ORDER — ONDANSETRON HCL 4 MG/2ML IJ SOLN: 4.0000 mg | Freq: Four times a day (QID) | INTRAMUSCULAR | Status: DC | PRN

## 2022-01-26 MED ORDER — ATORVASTATIN CALCIUM 40 MG PO TABS
80.0000 mg | ORAL_TABLET | Freq: Every day | ORAL | Status: DC
  Administered 2022-01-26: 80 mg via ORAL
  Filled 2022-01-26: qty 2

## 2022-01-26 MED ORDER — ENOXAPARIN SODIUM 80 MG/0.8ML IJ SOSY
70.0000 mg | PREFILLED_SYRINGE | INTRAMUSCULAR | Status: DC
  Administered 2022-01-26 – 2022-01-27 (×2): 70 mg via SUBCUTANEOUS
  Filled 2022-01-26 (×2): qty 0.8

## 2022-01-26 MED ORDER — ACETAMINOPHEN 325 MG PO TABS
650.0000 mg | ORAL_TABLET | Freq: Four times a day (QID) | ORAL | Status: DC | PRN
  Administered 2022-01-26 – 2022-01-27 (×3): 650 mg via ORAL
  Filled 2022-01-26 (×3): qty 2

## 2022-01-26 MED ORDER — POTASSIUM CHLORIDE CRYS ER 20 MEQ PO TBCR
40.0000 meq | EXTENDED_RELEASE_TABLET | Freq: Once | ORAL | Status: AC
  Administered 2022-01-26: 40 meq via ORAL
  Filled 2022-01-26: qty 2

## 2022-01-26 MED ORDER — NITROGLYCERIN 0.4 MG SL SUBL
0.4000 mg | SUBLINGUAL_TABLET | SUBLINGUAL | Status: DC | PRN
Start: 2022-01-26 — End: 2022-01-27

## 2022-01-26 MED ORDER — NITROGLYCERIN 0.4 MG SL SUBL
0.4000 mg | SUBLINGUAL_TABLET | Freq: Once | SUBLINGUAL | Status: AC
  Administered 2022-01-26 (×2): 0.4 mg via SUBLINGUAL
  Filled 2022-01-26: qty 1

## 2022-01-26 MED ORDER — BUPRENORPHINE HCL-NALOXONE HCL 2-0.5 MG SL SUBL
1.0000 | SUBLINGUAL_TABLET | Freq: Every day | SUBLINGUAL | Status: DC
  Administered 2022-01-26 – 2022-01-27 (×2): 1 via SUBLINGUAL
  Filled 2022-01-26 (×2): qty 1

## 2022-01-26 MED ORDER — TRAMADOL HCL 50 MG PO TABS: 50.0000 mg | ORAL_TABLET | Freq: Four times a day (QID) | ORAL | Status: DC | PRN

## 2022-01-26 MED ORDER — EMPAGLIFLOZIN 10 MG PO TABS
10.0000 mg | ORAL_TABLET | Freq: Every day | ORAL | Status: DC
Start: 2022-01-26 — End: 2022-01-27
  Administered 2022-01-26 – 2022-01-27 (×2): 10 mg via ORAL
  Filled 2022-01-26 (×2): qty 1

## 2022-01-26 MED ORDER — METOPROLOL TARTRATE 50 MG PO TABS
50.0000 mg | ORAL_TABLET | Freq: Two times a day (BID) | ORAL | Status: DC
  Administered 2022-01-26 – 2022-01-27 (×2): 50 mg via ORAL
  Filled 2022-01-26 (×3): qty 1

## 2022-01-26 MED ORDER — ASPIRIN 325 MG PO TABS: 325.0000 mg | ORAL_TABLET | Freq: Every day | ORAL | Status: DC

## 2022-01-26 MED ORDER — FUROSEMIDE 10 MG/ML IJ SOLN
40.0000 mg | Freq: Once | INTRAMUSCULAR | Status: AC
  Administered 2022-01-26: 40 mg via INTRAVENOUS
  Filled 2022-01-26: qty 4

## 2022-01-26 MED ORDER — ISOSORBIDE MONONITRATE ER 60 MG PO TB24
30.0000 mg | ORAL_TABLET | Freq: Every day | ORAL | Status: DC
  Administered 2022-01-26 – 2022-01-27 (×2): 30 mg via ORAL
  Filled 2022-01-26 (×2): qty 1

## 2022-01-26 MED ORDER — ASPIRIN 81 MG PO TBEC
81.0000 mg | DELAYED_RELEASE_TABLET | Freq: Every day | ORAL | Status: DC
  Administered 2022-01-26 – 2022-01-27 (×2): 81 mg via ORAL
  Filled 2022-01-26 (×2): qty 1

## 2022-01-26 MED ORDER — NITROGLYCERIN 0.4 MG SL SUBL
0.4000 mg | SUBLINGUAL_TABLET | Freq: Once | SUBLINGUAL | Status: AC
  Administered 2022-01-26: 0.4 mg via SUBLINGUAL

## 2022-01-26 NOTE — ED Notes (Signed)
Lab at bedside

## 2022-01-26 NOTE — ED Triage Notes (Signed)
Pt c/o left sided chest pain with radiation to left neck since about 1am this morning. Hx of 2 MI's last in 2015, pt still takes blood thinner. Pt also has noticed some bilateral lower extremity swelling.

## 2022-01-26 NOTE — Assessment & Plan Note (Signed)
Calculated BMI is 43,7  

## 2022-01-26 NOTE — Assessment & Plan Note (Signed)
Continue with statin therapy.  ?

## 2022-01-26 NOTE — Assessment & Plan Note (Signed)
K correction with KCl Follow up renal function in am.

## 2022-01-26 NOTE — Progress Notes (Signed)
Pt arrived to room 306 va WC from ED. Pt ambulatory from [facility] to foot scales to bed without assistance. Oriented to room and safety procedures, states understanding. Call bell within reach. Advised to use urinal for accurate measurement of urine output related to administration of diuretic. States understanding.

## 2022-01-26 NOTE — Assessment & Plan Note (Signed)
No signs of decompensation, plan to follow up as outpatient.

## 2022-01-27 DIAGNOSIS — I2510 Atherosclerotic heart disease of native coronary artery without angina pectoris: Secondary | ICD-10-CM | POA: Diagnosis not present

## 2022-01-27 DIAGNOSIS — E785 Hyperlipidemia, unspecified: Secondary | ICD-10-CM | POA: Diagnosis not present

## 2022-01-27 DIAGNOSIS — I5033 Acute on chronic diastolic (congestive) heart failure: Secondary | ICD-10-CM | POA: Diagnosis not present

## 2022-01-27 LAB — BASIC METABOLIC PANEL
Anion gap: 8 (ref 5–15)
BUN: 11 mg/dL (ref 6–20)
CO2: 26 mmol/L (ref 22–32)
Calcium: 8.4 mg/dL — ABNORMAL LOW (ref 8.9–10.3)
Chloride: 105 mmol/L (ref 98–111)
Creatinine, Ser: 1.12 mg/dL (ref 0.61–1.24)
GFR, Estimated: >60 mL/min (ref >60)
Glucose, Bld: 76 mg/dL (ref 70–99)
Potassium: 3.7 mmol/L (ref 3.5–5.1)
Sodium: 139 mmol/L (ref 135–145)

## 2022-01-27 LAB — MAGNESIUM: Magnesium: 1.7 mg/dL (ref 1.7–2.4)

## 2022-01-27 MED ORDER — ISOSORBIDE MONONITRATE ER 30 MG PO TB24
30.0000 mg | ORAL_TABLET | Freq: Every day | ORAL | 0 refills | Status: AC
Start: 2022-01-28 — End: ?

## 2022-01-27 MED ORDER — SENNOSIDES-DOCUSATE SODIUM 8.6-50 MG PO TABS: 1.0000 | ORAL_TABLET | Freq: Every evening | ORAL | Status: DC | PRN

## 2022-01-27 MED ORDER — IPRATROPIUM-ALBUTEROL 0.5-2.5 (3) MG/3ML IN SOLN: 3.0000 mL | RESPIRATORY_TRACT | Status: DC | PRN

## 2022-01-27 MED ORDER — TRAZODONE HCL 50 MG PO TABS: 50.0000 mg | ORAL_TABLET | Freq: Every evening | ORAL | Status: DC | PRN

## 2022-01-27 MED ORDER — EMPAGLIFLOZIN 10 MG PO TABS: 10.0000 mg | ORAL_TABLET | Freq: Every day | ORAL | 0 refills | Status: AC

## 2022-01-27 MED ORDER — GUAIFENESIN 100 MG/5ML PO LIQD: 5.0000 mL | ORAL | Status: DC | PRN

## 2022-01-27 MED ORDER — METOPROLOL TARTRATE 5 MG/5ML IV SOLN: 5.0000 mg | INTRAVENOUS | Status: DC | PRN

## 2022-01-27 MED ORDER — HYDRALAZINE HCL 20 MG/ML IJ SOLN: 10.0000 mg | INTRAMUSCULAR | Status: DC | PRN

## 2022-01-27 NOTE — Progress Notes (Signed)
Patient discharged home today, transported self home in personal vehicle per [facility] patient can drive self home.  Discharge paperwork went over with patient, patient verbalized understanding. Belongings sent home with patient.

## 2022-10-17 NOTE — Telephone Encounter (Signed)
done

## 2022-11-23 IMAGING — DX DG CHEST 1V PORT
1 series · 1 of 1 positions shown · non-contrast
Comparison: 08/10/2019

CLINICAL DATA: Chest pain

EXAM:
PORTABLE CHEST 1 VIEW

[chest ap]
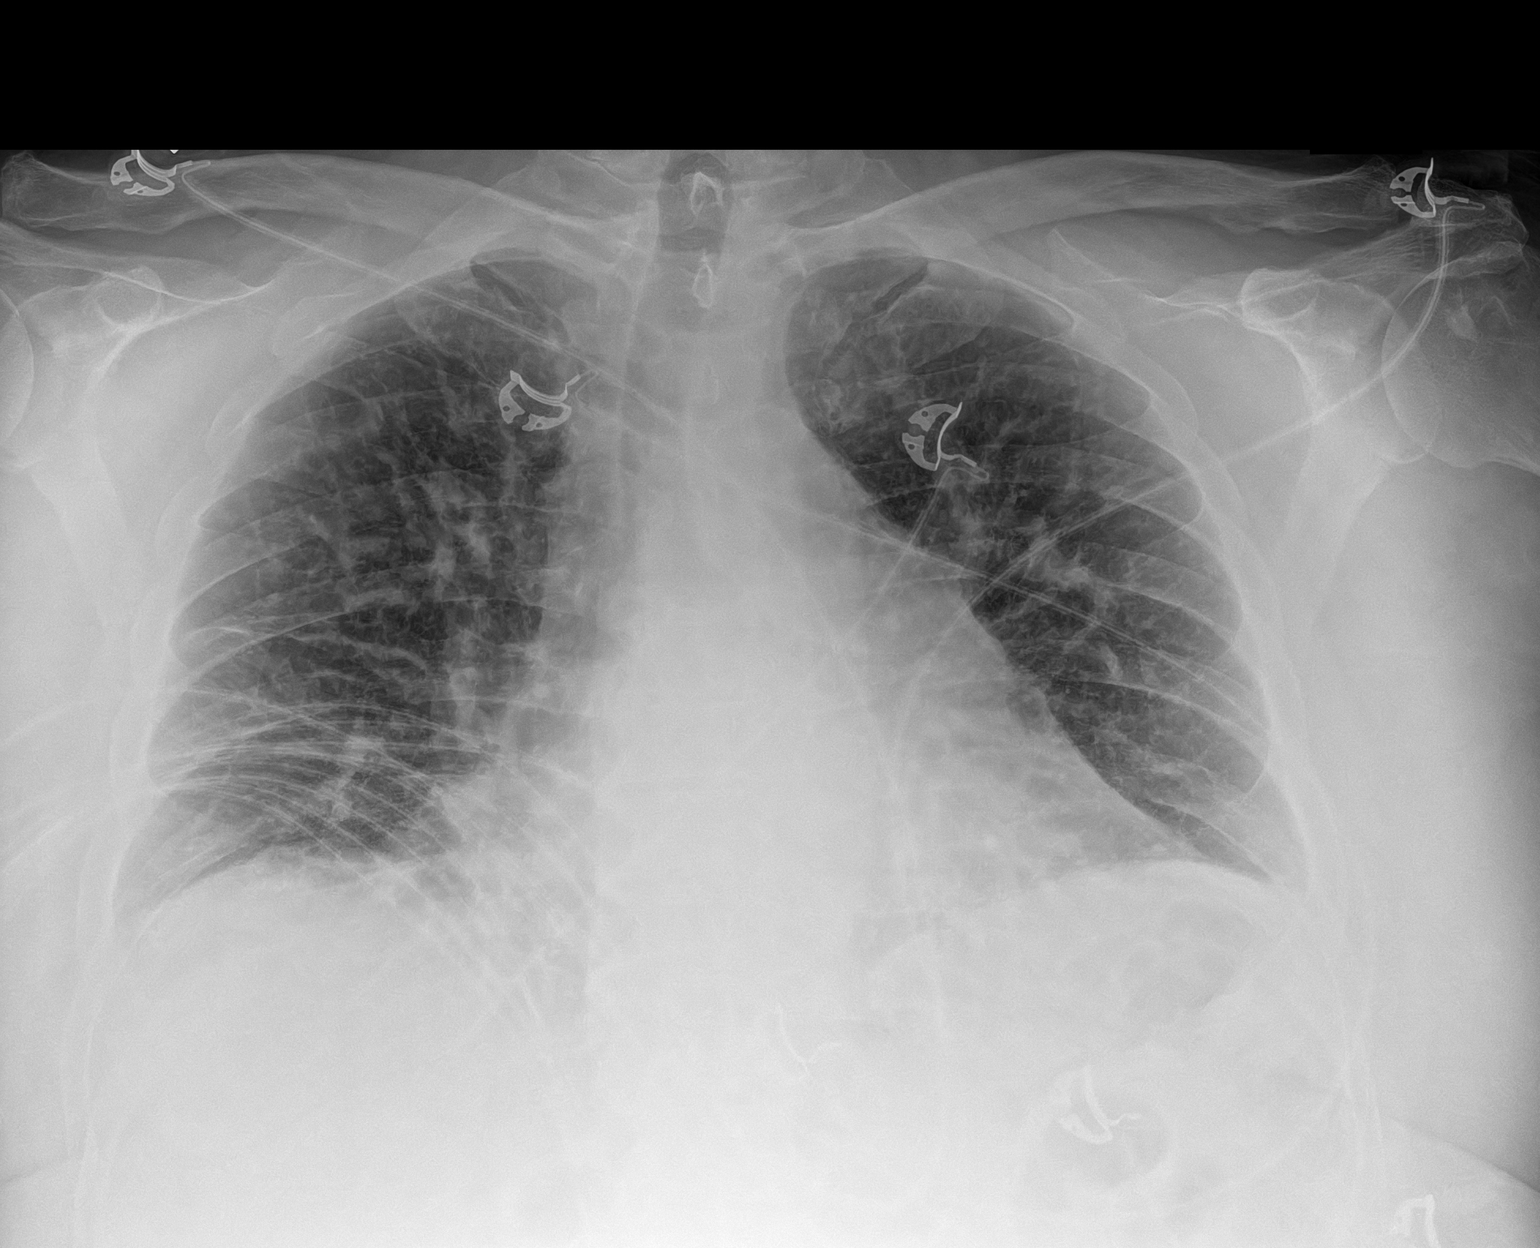

[1 of 1 positions shown; findings below may reference images not displayed]

FINDINGS: Cardiomegaly and main pulmonary artery prominence. Low volume chest
with diffuse interstitial prominence which is likely a combination
of vascular congestion and pulmonary sarcoidosis. There is artifact
from EKG leads. No visible effusion or pneumothorax.
IMPRESSION: 1. Low volume chest with vascular congestion.
2. Interstitial opacity attributed to chronic lung disease.
